# Patient Record
Sex: Female | Born: 1995 | Race: Black or African American | Hispanic: No | Marital: Single | Smoking: Never smoker
Health system: Southern US, Community
[De-identification: ages and names within clinical notes are randomized; demographics above are authoritative.]

## PROBLEM LIST (undated history)

## (undated) ENCOUNTER — Inpatient Hospital Stay (HOSPITAL_COMMUNITY): Payer: Self-pay

## (undated) DIAGNOSIS — D649 Anemia, unspecified: Secondary | ICD-10-CM

---

## 2018-08-07 LAB — OB RESULTS CONSOLE ABO/RH: RH Type: POSITIVE

## 2018-08-07 LAB — OB RESULTS CONSOLE RUBELLA ANTIBODY, IGM: Rubella: IMMUNE

## 2018-08-07 LAB — OB RESULTS CONSOLE HEPATITIS B SURFACE ANTIGEN: Hepatitis B Surface Ag: NEGATIVE

## 2018-08-07 LAB — OB RESULTS CONSOLE ANTIBODY SCREEN: Antibody Screen: NEGATIVE

## 2018-08-07 LAB — OB RESULTS CONSOLE GC/CHLAMYDIA
Chlamydia: NEGATIVE
Gonorrhea: NEGATIVE

## 2018-08-07 LAB — OB RESULTS CONSOLE HIV ANTIBODY (ROUTINE TESTING): HIV: NONREACTIVE

## 2018-08-07 LAB — OB RESULTS CONSOLE RPR: RPR: NONREACTIVE

## 2018-11-01 NOTE — L&D Delivery Note (Signed)
Delivery Note Patient pushed well for 1.5 hours. At 3:32 AM a viable female was delivered via  (Presentation: OA ).  APGAR: 5 , 8; weight 2970 gm (6lb 8.8oz).   Placenta status: Spontaneous, in tact .  Cord: 3V  with the following complications: None.  Cord pH: n/a  Anesthesia:  Epidural Episiotomy:  None Lacerations:  Right labial, repaired.  Left labial, hemostatic Suture Repair: 3.0 vicryl rapide Est. Blood Loss (mL):  300 mL  Following delivery, baby had good heart rate, but noted to have periodic breathing and oxygen requirement. Neonatologist call for evaluation.  Upon arrival she had good tone and no apnea however she needed BBO2 to maintain saturations in the 90's.  Given chorioamnionitis and gbs+ mom, she will be taken to NICU for r/o sepsis.   Mom to postpartum.  Baby to NICU.  Caitlin Bauer 01/20/2019, 4:03 AM

## 2018-12-21 ENCOUNTER — Other Ambulatory Visit (HOSPITAL_COMMUNITY): Payer: Self-pay | Admitting: *Deleted

## 2018-12-22 ENCOUNTER — Encounter (HOSPITAL_COMMUNITY): Payer: Medicaid Other

## 2018-12-29 ENCOUNTER — Encounter (HOSPITAL_COMMUNITY): Payer: Medicaid Other

## 2019-01-01 ENCOUNTER — Inpatient Hospital Stay (HOSPITAL_COMMUNITY)
Admission: AD | Admit: 2019-01-01 | Discharge: 2019-01-01 | Disposition: A | Discharge status: Home / Self Care | Payer: Medicaid Other | Attending: Obstetrics and Gynecology | Admitting: Obstetrics and Gynecology

## 2019-01-01 ENCOUNTER — Encounter (HOSPITAL_COMMUNITY): Payer: Self-pay

## 2019-01-01 DIAGNOSIS — Z3A35 35 weeks gestation of pregnancy: Secondary | ICD-10-CM | POA: Insufficient documentation

## 2019-01-01 DIAGNOSIS — O479 False labor, unspecified: Secondary | ICD-10-CM

## 2019-01-01 DIAGNOSIS — O4703 False labor before 37 completed weeks of gestation, third trimester: Secondary | ICD-10-CM | POA: Diagnosis not present

## 2019-01-01 HISTORY — DX: Anemia, unspecified: D64.9

## 2019-01-01 LAB — URINALYSIS, ROUTINE W REFLEX MICROSCOPIC
Bilirubin Urine: NEGATIVE
Glucose, UA: 50 mg/dL — AB
Hgb urine dipstick: NEGATIVE
Ketones, ur: NEGATIVE mg/dL
Nitrite: NEGATIVE
Protein, ur: NEGATIVE mg/dL
SPECIFIC GRAVITY, URINE: 1.006 (ref 1.005–1.030)
pH: 7 (ref 5.0–8.0)

## 2019-01-01 NOTE — MAU Note (Signed)
Having contractions-unsure how far apart.  No LOF/VB. +FM.  Reports no issues w/ her pregnancy.

## 2019-01-01 NOTE — Discharge Instructions (Signed)
Signs and Symptoms of Labor  Labor is your body's natural process of moving your baby, placenta, and umbilical cord out of your uterus. The process of labor usually starts when your baby is full-term, between 37 and 40 weeks of pregnancy.  How will I know when I am close to going into labor?  As your body prepares for labor and the birth of your baby, you may notice the following symptoms in the weeks and days before true labor starts:   Having a strong desire to get your home ready to receive your new baby. This is called nesting. Nesting may be a sign that labor is approaching, and it may occur several weeks before birth. Nesting may involve cleaning and organizing your home.   Passing a small amount of thick, bloody mucus out of your vagina (normal bloody show or losing your mucus plug). This may happen more than a week before labor begins, or it might occur right before labor begins as the opening of the cervix starts to widen (dilate). For some women, the entire mucus plug passes at once. For others, smaller portions of the mucus plug may gradually pass over several days.   Your baby moving (dropping) lower in your pelvis to get into position for birth (lightening). When this happens, you may feel more pressure on your bladder and pelvic bone and less pressure on your ribs. This may make it easier to breathe. It may also cause you to need to urinate more often and have problems with bowel movements.   Having "practice contractions" (Braxton Hicks contractions) that occur at irregular (unevenly spaced) intervals that are more than 10 minutes apart. This is also called false labor. False labor contractions are common after exercise or sexual activity, and they will stop if you change position, rest, or drink fluids. These contractions are usually mild and do not get stronger over time. They may feel like:  ? A backache or back pain.  ? Mild cramps, similar to menstrual cramps.  ? Tightening or pressure in  your abdomen.  Other early symptoms that labor may be starting soon include:   Nausea or loss of appetite.   Diarrhea.   Having a sudden burst of energy, or feeling very tired.   Mood changes.   Having trouble sleeping.  How will I know when labor has begun?  Signs that true labor has begun may include:   Having contractions that come at regular (evenly spaced) intervals and increase in intensity. This may feel like more intense tightening or pressure in your abdomen that moves to your back.  ? Contractions may also feel like rhythmic pain in your upper thighs or back that comes and goes at regular intervals.  ? For first-time mothers, this change in intensity of contractions often occurs at a more gradual pace.  ? Women who have given birth before may notice a more rapid progression of contraction changes.   Having a feeling of pressure in the vaginal area.   Your water breaking (rupture of membranes). This is when the sac of fluid that surrounds your baby breaks. When this happens, you will notice fluid leaking from your vagina. This may be clear or blood-tinged. Labor usually starts within 24 hours of your water breaking, but it may take longer to begin.  ? Some women notice this as a gush of fluid.  ? Others notice that their underwear repeatedly becomes damp.  Follow these instructions at home:     When labor   starts, or if your water breaks, call your health care provider or nurse care line. Based on your situation, they will determine when you should go in for an exam.   When you are in early labor, you may be able to rest and manage symptoms at home. Some strategies to try at home include:  ? Breathing and relaxation techniques.  ? Taking a warm bath or shower.  ? Listening to music.  ? Using a heating pad on the lower back for pain. If you are directed to use heat:   Place a towel between your skin and the heat source.   Leave the heat on for 20-30 minutes.   Remove the heat if your skin turns  bright red. This is especially important if you are unable to feel pain, heat, or cold. You may have a greater risk of getting burned.  Get help right away if:   You have painful, regular contractions that are 5 minutes apart or less.   Labor starts before you are [redacted] weeks along in your pregnancy.   You have a fever.   You have a headache that does not go away.   You have bright red blood coming from your vagina.   You do not feel your baby moving.   You have a sudden onset of:  ? Severe headache with vision problems.  ? Nausea, vomiting, or diarrhea.  ? Chest pain or shortness of breath.  These symptoms may be an emergency. If your health care provider recommends that you go to the hospital or birth center where you plan to deliver, do not drive yourself. Have someone else drive you, or call emergency services (911 in the [place]  Summary   Labor is your body's natural process of moving your baby, placenta, and umbilical cord out of your uterus.   The process of labor usually starts when your baby is full-term, between 37 and 40 weeks of pregnancy.   When labor starts, or if your water breaks, call your health care provider or nurse care line. Based on your situation, they will determine when you should go in for an exam.  This information is not intended to replace advice given to you by your health care provider. Make sure you discuss any questions you have with your health care provider.  Document Released: 03/25/2017 Document Revised: 03/25/2017 Document Reviewed: 03/25/2017  Elsevier Interactive Patient Education  2019 [employer]  Fetal Movement Counts  Patient Name: ________________________________________________ Patient Due Date: ____________________  What is a fetal movement count?    A fetal movement count is the number of times that you feel your baby move during a certain amount of time. This may also be called a fetal kick count. A fetal movement count is recommended for every pregnant  woman. You may be asked to start counting fetal movements as early as week 28 of your pregnancy.  Pay attention to when your baby is most active. You may notice your baby's sleep and wake cycles. You may also notice things that make your baby move more. You should do a fetal movement count:   When your baby is normally most active.   At the same time each day.  A good time to count movements is while you are resting, after having something to eat and drink.  How do I count fetal movements?  1. Find a quiet, comfortable area. Sit, or lie down on your side.  2. Write down the date, the start   time and stop time, and the number of movements that you felt between those two times. Take this information with you to your health care visits.  3. For 2 hours, count kicks, flutters, swishes, rolls, and jabs. You should feel at least 10 movements during 2 hours.  4. You may stop counting after you have felt 10 movements.  5. If you do not feel 10 movements in 2 hours, have something to eat and drink. Then, keep resting and counting for 1 hour. If you feel at least 4 movements during that hour, you may stop counting.  Contact a health care provider if:   You feel fewer than 4 movements in 2 hours.   Your baby is not moving like he or she usually does.  Date: ____________ Start time: ____________ Stop time: ____________ Movements: ____________  Date: ____________ Start time: ____________ Stop time: ____________ Movements: ____________  Date: ____________ Start time: ____________ Stop time: ____________ Movements: ____________  Date: ____________ Start time: ____________ Stop time: ____________ Movements: ____________  Date: ____________ Start time: ____________ Stop time: ____________ Movements: ____________  Date: ____________ Start time: ____________ Stop time: ____________ Movements: ____________  Date: ____________ Start time: ____________ Stop time: ____________ Movements: ____________  Date: ____________ Start time:  ____________ Stop time: ____________ Movements: ____________  Date: ____________ Start time: ____________ Stop time: ____________ Movements: ____________  This information is not intended to replace advice given to you by your health care provider. Make sure you discuss any questions you have with your health care provider.  Document Released: 11/17/2006 Document Revised: 06/16/2016 Document Reviewed: 11/27/2015  Elsevier Interactive Patient Education  2019 [employer]

## 2019-01-01 NOTE — MAU Provider Note (Signed)
Chief Complaint:  Contractions   First Provider Initiated Contact with Patient 01/01/19 2039     HPI: Caitlin Bauer is a 23 y.o. G1P0 at [redacted]w[redacted]d who presents to maternity admissions reporting contractions. Can't tell how frequent they are but feels like they are more frequent & painful than her normal braxton hicks. Had intercourse earlier today. Denies leakage of fluid or vaginal bleeding. Good fetal movement.  Location: low back Quality: contraction Severity: 9/10 in pain scale Duration: <1 day Timing: intermittent Modifying factors: none Associated signs and symptoms: none  Past Medical History:  Diagnosis Date  . Anemia    OB History  Gravida Para Term Preterm AB Living  1            SAB TAB Ectopic Multiple Live Births               # Outcome Date GA Lbr Len/2nd Weight Sex Delivery Anes PTL Lv  1 Current            History reviewed. No pertinent surgical history. No family history on file. Social History   Tobacco Use  . Smoking status: Not on file  Substance Use Topics  . Alcohol use: Not on file  . Drug use: Not on file   No Known Allergies No medications prior to admission.    I have reviewed patient's Past Medical Hx, Surgical Hx, Family Hx, Social Hx, medications and allergies.   ROS:  Review of Systems  Constitutional: Negative.   Gastrointestinal: Negative.   Genitourinary: Negative.   Musculoskeletal: Positive for back pain.    Physical Exam   Patient Vitals for the past 24 hrs:  BP Temp Pulse Resp SpO2 Height Weight  01/01/19 2200 102/61 - 80 19 - - -  01/01/19 2037 (!) 94/58 98.5 F (36.9 C) 89 19 100 % 5\' 3"  (1.6 m) 63.2 kg    Constitutional: Well-developed, well-nourished female in no acute distress.  Cardiovascular: normal rate & rhythm, no murmur Respiratory: normal effort, lung sounds clear throughout GI: Abd soft, non-tender, gravid appropriate for gestational age. Pos BS x 4 MS: Extremities nontender, no edema, normal ROM Neurologic:  Alert and oriented x 4.  GU:      Pelvic: NEFG, physiologic discharge, no blood, cervix clean.   Dilation: 1 Effacement (%): 60 Cervical Position: Posterior Presentation: Vertex Exam by:: Judeth Horn, NP  NST:  Baseline: 140 bpm, Variability: Good {> 6 bpm), Accelerations: Reactive and Decelerations: Absent   Labs: Results for orders placed or performed during the hospital encounter of 01/01/19 (from the past 24 hour(s))  Urinalysis, Routine w reflex microscopic     Status: Abnormal   Collection Time: 01/01/19  8:18 PM  Result Value Ref Range   Color, Urine STRAW (A) YELLOW   APPearance CLEAR CLEAR   Specific Gravity, Urine 1.006 1.005 - 1.030   pH 7.0 5.0 - 8.0   Glucose, UA 50 (A) NEGATIVE mg/dL   Hgb urine dipstick NEGATIVE NEGATIVE   Bilirubin Urine NEGATIVE NEGATIVE   Ketones, ur NEGATIVE NEGATIVE mg/dL   Protein, ur NEGATIVE NEGATIVE mg/dL   Nitrite NEGATIVE NEGATIVE   Leukocytes,Ua LARGE (A) NEGATIVE   RBC / HPF 0-5 0 - 5 RBC/hpf   WBC, UA 11-20 0 - 5 WBC/hpf   Bacteria, UA FEW (A) NONE SEEN   Squamous Epithelial / LPF 0-5 0 - 5   Mucus PRESENT     Imaging:  No results found.  MAU Course: Orders Placed This Encounter  Procedures  .  Culture, OB Urine  . Urinalysis, Routine w reflex microscopic  . Discharge patient   No orders of the defined types were placed in this encounter.   MDM: Reactive NST Not consistently contracting on monitor. Cervix unchanged after 1+ hr of monitoring & pt reports ctx have spaced out.   Assessment: 1. False labor   2. [redacted] weeks gestation of pregnancy     Plan: Discharge home in stable condition.  Preterm Labor precautions and fetal kick counts Follow-up Information    Ob/Gyn, [facility] Follow up.   Contact information: [address] [place] [address] [phone]           Allergies as of 01/01/2019   No Known Allergies     Medication List    TAKE these medications   ferrous sulfate 325  (65 FE) MG EC tablet Take 325 mg by mouth 3 (three) times daily with meals.       Judeth Horn, NP 01/01/2019 10:47 PM

## 2019-01-04 LAB — CULTURE, OB URINE

## 2019-01-05 ENCOUNTER — Encounter (HOSPITAL_COMMUNITY): Payer: Self-pay

## 2019-01-05 ENCOUNTER — Telehealth (HOSPITAL_COMMUNITY): Payer: Self-pay

## 2019-01-05 DIAGNOSIS — B951 Streptococcus, group B, as the cause of diseases classified elsewhere: Secondary | ICD-10-CM | POA: Insufficient documentation

## 2019-01-05 NOTE — Progress Notes (Signed)
Patient GBS returns positive for 1k colonies of GBS.  Attempted to contact via phone x 2.  Will add to problem list and plan to treat in pregnancy.  Cherre Robins MSN, CNM 01/05/2019 3:59 PM

## 2019-01-15 ENCOUNTER — Other Ambulatory Visit (HOSPITAL_COMMUNITY): Payer: Self-pay | Admitting: *Deleted

## 2019-01-16 ENCOUNTER — Other Ambulatory Visit: Payer: Self-pay

## 2019-01-16 ENCOUNTER — Ambulatory Visit (HOSPITAL_COMMUNITY)
Admission: RE | Admit: 2019-01-16 | Discharge: 2019-01-16 | Disposition: A | Discharge status: Home / Self Care | Payer: Medicaid Other | Source: Ambulatory Visit | Attending: Obstetrics | Admitting: Obstetrics

## 2019-01-16 DIAGNOSIS — D649 Anemia, unspecified: Secondary | ICD-10-CM | POA: Diagnosis present

## 2019-01-16 MED ORDER — SODIUM CHLORIDE 0.9 % IV SOLN
510.0000 mg | INTRAVENOUS | Status: DC
  Administered 2019-01-16: 510 mg via INTRAVENOUS
  Filled 2019-01-16: qty 510

## 2019-01-16 NOTE — Discharge Instructions (Signed)

## 2019-01-18 ENCOUNTER — Inpatient Hospital Stay (EMERGENCY_DEPARTMENT_HOSPITAL)
Admission: AD | Admit: 2019-01-18 | Discharge: 2019-01-19 | Disposition: A | Discharge status: Home / Self Care | Payer: Medicaid Other | Source: Home / Self Care | Attending: Obstetrics and Gynecology | Admitting: Obstetrics and Gynecology

## 2019-01-18 ENCOUNTER — Inpatient Hospital Stay (HOSPITAL_COMMUNITY): Payer: Medicaid Other

## 2019-01-18 ENCOUNTER — Inpatient Hospital Stay (HOSPITAL_COMMUNITY)
Admission: AD | Admit: 2019-01-18 | Discharge: 2019-01-18 | Disposition: A | Discharge status: Home / Self Care | Payer: Medicaid Other | Source: Home / Self Care | Attending: Obstetrics and Gynecology | Admitting: Obstetrics and Gynecology

## 2019-01-18 ENCOUNTER — Other Ambulatory Visit: Payer: Self-pay

## 2019-01-18 ENCOUNTER — Encounter (HOSPITAL_COMMUNITY): Payer: Self-pay | Admitting: *Deleted

## 2019-01-18 DIAGNOSIS — O479 False labor, unspecified: Secondary | ICD-10-CM | POA: Insufficient documentation

## 2019-01-18 DIAGNOSIS — Z3A38 38 weeks gestation of pregnancy: Secondary | ICD-10-CM | POA: Insufficient documentation

## 2019-01-18 DIAGNOSIS — O368390 Maternal care for abnormalities of the fetal heart rate or rhythm, unspecified trimester, not applicable or unspecified: Secondary | ICD-10-CM

## 2019-01-18 DIAGNOSIS — O368330 Maternal care for abnormalities of the fetal heart rate or rhythm, third trimester, not applicable or unspecified: Secondary | ICD-10-CM

## 2019-01-18 DIAGNOSIS — Z79899 Other long term (current) drug therapy: Secondary | ICD-10-CM | POA: Insufficient documentation

## 2019-01-18 DIAGNOSIS — O368391 Maternal care for abnormalities of the fetal heart rate or rhythm, unspecified trimester, fetus 1: Secondary | ICD-10-CM

## 2019-01-18 LAB — POCT FERN TEST: POCT Fern Test: NEGATIVE

## 2019-01-18 NOTE — MAU Provider Note (Signed)
Chief Complaint:  Contractions   First Provider Initiated Contact with Patient 01/18/19 2235      HPI: Caitlin Bauer is a 23 y.o. G1P0 at 45w2dwho presents to maternity admissions reporting contractions and wetness on her clothes as she arrived here.  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 . She reports good fetal movement, denies vaginal itching/burning, urinary symptoms, h/a, dizziness, n/v, diarrhea, constipation or fever/chills.  She denies headache, visual changes or RUQ abdominal pain.   Past Medical History: Past Medical History:  Diagnosis Date  . Anemia     Past obstetric history: OB History  Gravida Para Term Preterm AB Living  1            SAB TAB Ectopic Multiple Live Births               # Outcome Date GA Lbr Len/2nd Weight Sex Delivery Anes PTL Lv  1 Current             Past Surgical History: No past surgical history on file.  Family History: No family history on file.  Social History: Social History   Tobacco Use  . Smoking status: Never Smoker  . Smokeless tobacco: Never Used  Substance Use Topics  . Alcohol use: Not on file  . Drug use: Not on file    Allergies: No Known Allergies  Meds:  Medications Prior to Admission  Medication Sig Dispense Refill Last Dose  . ferrous sulfate 325 (65 FE) MG EC tablet Take 325 mg by mouth 3 (three) times daily with meals.   01/17/2019 at Unknown time  . valACYclovir (VALTREX) 500 MG tablet Take 500 mg by mouth 2 (two) times daily.   01/17/2019 at Unknown time    I have reviewed patient's Past Medical Hx, Surgical Hx, Family Hx, Social Hx, medications and allergies.   ROS:  Review of Systems  Constitutional: Negative for fever.  Gastrointestinal: Positive for abdominal pain. Negative for constipation, diarrhea and nausea.  Genitourinary: Positive for pelvic pain, vaginal bleeding and vaginal discharge. Negative for dysuria.   Other  systems negative  Physical Exam   Patient Vitals for the past 24 hrs:  BP Temp Pulse Resp SpO2 Height Weight  01/18/19 2203 (!) 100/55 98.3 F (36.8 C) 95 19 96 % 5\' 3"  (1.6 m) 63 kg   Constitutional: Well-developed, well-nourished female in no acute distress.  Cardiovascular: normal rate and rhythm Respiratory: normal effort, clear to auscultation bilaterally GI: Abd soft, non-tender, gravid appropriate for gestational age.   No rebound or guarding. MS: Extremities nontender, no edema, normal ROM Neurologic: Alert and oriented x 4.  GU: Neg CVAT.  PELVIC EXAM: scant pink creamy discharge, vaginal walls and external genitalia normal, no lesions visible, though there is one small area at introitus which is darker pink but not tender and not excoriated.   Negative pooling, no ferning  Bimanual exam: Cervix firm, posterior, neg CMT, uterus nontender, Fundal Height consistent with dates, adnexa without tenderness, enlargement, or mass Dilation: 2.5 Effacement (%): 50 Station: -2 Presentation: Vertex Exam by:: Lauren Cox RN     Repeat cervical exam is unchanged  FHT:  Baseline 140 , moderate variability, accelerations present, no decelerations Contractions:  Irregular     Labs: Results for orders placed or performed during the hospital encounter of 01/18/19 (from the past 24 hour(s))  POCT fern test     Status:  Normal   Collection Time: 01/18/19 10:36 PM  Result Value Ref Range   POCT Fern Test Negative = intact amniotic membranes     B/Positive/-- (10/07 0000)  Imaging:  No results found.  MAU Course/MDM: Speculum exam was negative for ruptured membranes NST reviewed, reactive, irregular contraction pattern. Fetal heart rate pattern was reactive but there was a single variable decel with a triplet contraction.  We ordered a BPP which scored 6/8 (2 off for breathing)  We monitored her after the BPP and FHR pattern was reactive. I had Dr Debroah Loop review entire tracing and BPP  and he felt it was reassuring enough to let her go home Discussed lack of cervical change and discussed progression of labor, what to watch for. Encouraged her to come back when she felt like she needed to.   Consult Dr Debroah Loop with presentation, exam findings and test results.  Treatments in MAU included EFM, Speculum Exam.    Assessment: Single intrauterine pregnancy at [redacted]w[redacted]d Prodromal contractions vs early latent phase Reassuring NST and BPP  Plan: Discharge home Labor precautions and fetal kick counts Follow up in Office for prenatal visits and recheck of status  Encouraged to return here or to other Urgent Care/ED if she develops worsening of symptoms, increase in pain, fever, or other concerning symptoms.   Pt stable at time of discharge.  Wynelle Bourgeois CNM, MSN Certified Nurse-Midwife 01/18/2019 10:35 PM

## 2019-01-18 NOTE — Discharge Instructions (Signed)
Fetal Movement Counts °Patient Name: ________________________________________________ Patient Due Date: ____________________ °What is a fetal movement count? ° °A fetal movement count is the number of times that you feel your baby move during a certain amount of time. This may also be called a fetal kick count. A fetal movement count is recommended for every pregnant woman. You may be asked to start counting fetal movements as early as week 28 of your pregnancy. °Pay attention to when your baby is most active. You may notice your baby's sleep and wake cycles. You may also notice things that make your baby move more. You should do a fetal movement count: °· When your baby is normally most active. °· At the same time each day. °A good time to count movements is while you are resting, after having something to eat and drink. °How do I count fetal movements? °1. Find a quiet, comfortable area. Sit, or lie down on your side. °2. Write down the date, the start time and stop time, and the number of movements that you felt between those two times. Take this information with you to your health care visits. °3. For 2 hours, count kicks, flutters, swishes, rolls, and jabs. You should feel at least 10 movements during 2 hours. °4. You may stop counting after you have felt 10 movements. °5. If you do not feel 10 movements in 2 hours, have something to eat and drink. Then, keep resting and counting for 1 hour. If you feel at least 4 movements during that hour, you may stop counting. °Contact a health care provider if: °· You feel fewer than 4 movements in 2 hours. °· Your baby is not moving like he or she usually does. °Date: ____________ Start time: ____________ Stop time: ____________ Movements: ____________ °Date: ____________ Start time: ____________ Stop time: ____________ Movements: ____________ °Date: ____________ Start time: ____________ Stop time: ____________ Movements: ____________ °Date: ____________ Start time:  ____________ Stop time: ____________ Movements: ____________ °Date: ____________ Start time: ____________ Stop time: ____________ Movements: ____________ °Date: ____________ Start time: ____________ Stop time: ____________ Movements: ____________ °Date: ____________ Start time: ____________ Stop time: ____________ Movements: ____________ °Date: ____________ Start time: ____________ Stop time: ____________ Movements: ____________ °Date: ____________ Start time: ____________ Stop time: ____________ Movements: ____________ °This information is not intended to replace advice given to you by your health care provider. Make sure you discuss any questions you have with your health care provider. °Document Released: 11/17/2006 Document Revised: 06/16/2016 Document Reviewed: 11/27/2015 °Elsevier Interactive Patient Education © 2019 [employer] °Signs and Symptoms of Labor °Labor is your body's natural process of moving your baby, placenta, and umbilical cord out of your uterus. The process of labor usually starts when your baby is full-term, between 37 and 40 weeks of pregnancy. °How will I know when I am close to going into labor? °As your body prepares for labor and the birth of your baby, you may notice the following symptoms in the weeks and days before true labor starts: °· Having a strong desire to get your home ready to receive your new baby. This is called nesting. Nesting may be a sign that labor is approaching, and it may occur several weeks before birth. Nesting may involve cleaning and organizing your home. °· Passing a small amount of thick, bloody mucus out of your vagina (normal bloody show or losing your mucus plug). This may happen more than a week before labor begins, or it might occur right before labor begins as the opening of the cervix   starts to widen (dilate). For some women, the entire mucus plug passes at once. For others, smaller portions of the mucus plug may gradually pass over several  days. °· Your baby moving (dropping) lower in your pelvis to get into position for birth (lightening). When this happens, you may feel more pressure on your bladder and pelvic bone and less pressure on your ribs. This may make it easier to breathe. It may also cause you to need to urinate more often and have problems with bowel movements. °· Having "practice contractions" (Braxton Hicks contractions) that occur at irregular (unevenly spaced) intervals that are more than 10 minutes apart. This is also called false labor. False labor contractions are common after exercise or sexual activity, and they will stop if you change position, rest, or drink fluids. These contractions are usually mild and do not get stronger over time. They may feel like: °? A backache or back pain. °? Mild cramps, similar to menstrual cramps. °? Tightening or pressure in your abdomen. °Other early symptoms that labor may be starting soon include: °· Nausea or loss of appetite. °· Diarrhea. °· Having a sudden burst of energy, or feeling very tired. °· Mood changes. °· Having trouble sleeping. °How will I know when labor has begun? °Signs that true labor has begun may include: °· Having contractions that come at regular (evenly spaced) intervals and increase in intensity. This may feel like more intense tightening or pressure in your abdomen that moves to your back. °? Contractions may also feel like rhythmic pain in your upper thighs or back that comes and goes at regular intervals. °? For first-time mothers, this change in intensity of contractions often occurs at a more gradual pace. °? Women who have given birth before may notice a more rapid progression of contraction changes. °· Having a feeling of pressure in the vaginal area. °· Your water breaking (rupture of membranes). This is when the sac of fluid that surrounds your baby breaks. When this happens, you will notice fluid leaking from your vagina. This may be clear or blood-tinged.  Labor usually starts within 24 hours of your water breaking, but it may take longer to begin. °? Some women notice this as a gush of fluid. °? Others notice that their underwear repeatedly becomes damp. °Follow these instructions at home: ° °· When labor starts, or if your water breaks, call your health care provider or nurse care line. Based on your situation, they will determine when you should go in for an exam. °· When you are in early labor, you may be able to rest and manage symptoms at home. Some strategies to try at home include: °? Breathing and relaxation techniques. °? Taking a warm bath or shower. °? Listening to music. °? Using a heating pad on the lower back for pain. If you are directed to use heat: °§ Place a towel between your skin and the heat source. °§ Leave the heat on for 20-30 minutes. °§ Remove the heat if your skin turns bright red. This is especially important if you are unable to feel pain, heat, or cold. You may have a greater risk of getting burned. °Get help right away if: °· You have painful, regular contractions that are 5 minutes apart or less. °· Labor starts before you are [redacted] weeks along in your pregnancy. °· You have a fever. °· You have a headache that does not go away. °· You have bright red blood coming from your vagina. °·   You do not feel your baby moving. °· You have a sudden onset of: °? Severe headache with vision problems. °? Nausea, vomiting, or diarrhea. °? Chest pain or shortness of breath. °These symptoms may be an emergency. If your health care provider recommends that you go to the hospital or birth center where you plan to deliver, do not drive yourself. Have someone else drive you, or call emergency services (911 in the [place] °Summary °· Labor is your body's natural process of moving your baby, placenta, and umbilical cord out of your uterus. °· The process of labor usually starts when your baby is full-term, between 37 and 40 weeks of pregnancy. °· When labor  starts, or if your water breaks, call your health care provider or nurse care line. Based on your situation, they will determine when you should go in for an exam. °This information is not intended to replace advice given to you by your health care provider. Make sure you discuss any questions you have with your health care provider. °Document Released: 03/25/2017 Document Revised: 03/25/2017 Document Reviewed: 03/25/2017 °Elsevier Interactive Patient Education © 2019 [employer] ° °

## 2019-01-18 NOTE — MAU Note (Signed)
Pt presents to MAU with complaints of contractions that started last night. Denies any LOF or VB. +FM

## 2019-01-18 NOTE — MAU Provider Note (Signed)
S: Patient is here for RN labor evaluation. Strip, vital signs, & chart Reviewed   O:  Vitals:   01/18/19 1109  BP: 114/60  Pulse: 99  Resp: 16  Temp: 98.2 F (36.8 C)   No results found for this or any previous visit (from the past 24 hour(s)).  Dilation: 2 Effacement (%): 70 Cervical Position: Posterior Station: -3 Presentation: Vertex Exam by:: Ginger Morris RN   FHR: 145 bpm, Mod Var, No  Decels, 15x15 Accels UC: irregular ctx   A: 1. False labor   2. [redacted] weeks gestation of pregnancy      P:  RN to discharge home in stable condition with return precautions & fetal kick counts  Judeth Horn FNP 12:25 PM

## 2019-01-18 NOTE — MAU Note (Addendum)
CTX around 4 minutes apart.  Reports feeling "wet" but no large gush of fluid.  Saw some spotting earlier in her mucous plug. Endorses + FM. GBS positive.  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  Takes it everyday "when she can remember."  Reports no recent outbreaks.

## 2019-01-19 ENCOUNTER — Inpatient Hospital Stay (HOSPITAL_COMMUNITY): Payer: Medicaid Other | Admitting: Anesthesiology

## 2019-01-19 ENCOUNTER — Other Ambulatory Visit: Payer: Self-pay

## 2019-01-19 ENCOUNTER — Inpatient Hospital Stay (HOSPITAL_COMMUNITY)
Admission: AC | Admit: 2019-01-19 | Discharge: 2019-01-19 | Disposition: A | Discharge status: Home / Self Care | Payer: Medicaid Other | Source: Home / Self Care | Attending: Obstetrics | Admitting: Obstetrics

## 2019-01-19 ENCOUNTER — Encounter (HOSPITAL_COMMUNITY): Payer: Self-pay | Admitting: *Deleted

## 2019-01-19 ENCOUNTER — Inpatient Hospital Stay (HOSPITAL_COMMUNITY)
Admission: AD | Admit: 2019-01-19 | Discharge: 2019-01-22 | DRG: 805 | Disposition: A | Discharge status: Home / Self Care | Payer: Medicaid Other | Attending: Obstetrics | Admitting: Obstetrics

## 2019-01-19 DIAGNOSIS — O9832 Other infections with a predominantly sexual mode of transmission complicating childbirth: Secondary | ICD-10-CM | POA: Diagnosis present

## 2019-01-19 DIAGNOSIS — O411230 Chorioamnionitis, third trimester, not applicable or unspecified: Secondary | ICD-10-CM | POA: Diagnosis present

## 2019-01-19 DIAGNOSIS — D649 Anemia, unspecified: Secondary | ICD-10-CM | POA: Diagnosis present

## 2019-01-19 DIAGNOSIS — Z3A38 38 weeks gestation of pregnancy: Secondary | ICD-10-CM | POA: Diagnosis not present

## 2019-01-19 DIAGNOSIS — O99824 Streptococcus B carrier state complicating childbirth: Secondary | ICD-10-CM | POA: Diagnosis present

## 2019-01-19 DIAGNOSIS — O9902 Anemia complicating childbirth: Secondary | ICD-10-CM | POA: Diagnosis present

## 2019-01-19 DIAGNOSIS — O26893 Other specified pregnancy related conditions, third trimester: Secondary | ICD-10-CM | POA: Diagnosis present

## 2019-01-19 DIAGNOSIS — A6000 Herpesviral infection of urogenital system, unspecified: Secondary | ICD-10-CM | POA: Diagnosis present

## 2019-01-19 LAB — CBC
HCT: 31.6 % — ABNORMAL LOW (ref 36.0–46.0)
Hemoglobin: 9.8 g/dL — ABNORMAL LOW (ref 12.0–15.0)
MCH: 28.2 pg (ref 26.0–34.0)
MCHC: 31 g/dL (ref 30.0–36.0)
MCV: 90.8 fL (ref 80.0–100.0)
PLATELETS: 220 10*3/uL (ref 150–400)
RBC: 3.48 MIL/uL — ABNORMAL LOW (ref 3.87–5.11)
RDW: 16.7 % — ABNORMAL HIGH (ref 11.5–15.5)
WBC: 13.5 10*3/uL — ABNORMAL HIGH (ref 4.0–10.5)
nRBC: 0 % (ref 0.0–0.2)

## 2019-01-19 LAB — TYPE AND SCREEN
ABO/RH(D): B POS
Antibody Screen: NEGATIVE

## 2019-01-19 LAB — ABO/RH: ABO/RH(D): B POS

## 2019-01-19 MED ORDER — OXYTOCIN 40 UNITS IN NORMAL SALINE INFUSION - SIMPLE MED
2.5000 [IU]/h | INTRAVENOUS | Status: DC
  Administered 2019-01-20: 2.5 [IU]/h via INTRAVENOUS
  Filled 2019-01-19: qty 1000

## 2019-01-19 MED ORDER — SODIUM CHLORIDE 0.9 % IV SOLN
2.0000 g | Freq: Four times a day (QID) | INTRAVENOUS | Status: DC
  Administered 2019-01-19: 2 g via INTRAVENOUS
  Filled 2019-01-19: qty 2000

## 2019-01-19 MED ORDER — LIDOCAINE HCL (PF) 1 % IJ SOLN
30.0000 mL | INTRAMUSCULAR | Status: DC | PRN
  Filled 2019-01-19: qty 30

## 2019-01-19 MED ORDER — LACTATED RINGERS IV SOLN
INTRAVENOUS | Status: DC
  Administered 2019-01-19: 17:00:00 via INTRAVENOUS

## 2019-01-19 MED ORDER — GENTAMICIN SULFATE 40 MG/ML IJ SOLN
5.0000 mg/kg | INTRAVENOUS | Status: DC
  Administered 2019-01-20: 320 mg via INTRAVENOUS
  Filled 2019-01-19 (×2): qty 8

## 2019-01-19 MED ORDER — DIPHENHYDRAMINE HCL 50 MG/ML IJ SOLN: 12.5000 mg | INTRAMUSCULAR | Status: DC | PRN

## 2019-01-19 MED ORDER — EPHEDRINE 5 MG/ML INJ: 10.0000 mg | INTRAVENOUS | Status: DC | PRN

## 2019-01-19 MED ORDER — LACTATED RINGERS AMNIOINFUSION
INTRAVENOUS | Status: DC
  Administered 2019-01-19: via INTRAUTERINE

## 2019-01-19 MED ORDER — OXYCODONE-ACETAMINOPHEN 5-325 MG PO TABS: 2.0000 | ORAL_TABLET | ORAL | Status: DC | PRN

## 2019-01-19 MED ORDER — PHENYLEPHRINE 40 MCG/ML (10ML) SYRINGE FOR IV PUSH (FOR BLOOD PRESSURE SUPPORT): 80.0000 ug | PREFILLED_SYRINGE | INTRAVENOUS | Status: DC | PRN

## 2019-01-19 MED ORDER — OXYTOCIN BOLUS FROM INFUSION
500.0000 mL | Freq: Once | INTRAVENOUS | Status: AC
  Administered 2019-01-20: 500 mL via INTRAVENOUS

## 2019-01-19 MED ORDER — ONDANSETRON HCL 4 MG/2ML IJ SOLN: 4.0000 mg | Freq: Four times a day (QID) | INTRAMUSCULAR | Status: DC | PRN

## 2019-01-19 MED ORDER — LACTATED RINGERS IV SOLN: 500.0000 mL | INTRAVENOUS | Status: DC | PRN

## 2019-01-19 MED ORDER — OXYCODONE-ACETAMINOPHEN 5-325 MG PO TABS: 1.0000 | ORAL_TABLET | ORAL | Status: DC | PRN

## 2019-01-19 MED ORDER — SOD CITRATE-CITRIC ACID 500-334 MG/5ML PO SOLN: 30.0000 mL | ORAL | Status: DC | PRN

## 2019-01-19 MED ORDER — SODIUM CHLORIDE 0.9 % IV SOLN
5.0000 10*6.[IU] | Freq: Once | INTRAVENOUS | Status: AC
  Administered 2019-01-19: 5 10*6.[IU] via INTRAVENOUS
  Filled 2019-01-19: qty 5

## 2019-01-19 MED ORDER — LIDOCAINE HCL (PF) 1 % IJ SOLN
INTRAMUSCULAR | Status: DC | PRN
  Administered 2019-01-19 (×2): 6 mL via EPIDURAL

## 2019-01-19 MED ORDER — FENTANYL-BUPIVACAINE-NACL 0.5-0.125-0.9 MG/250ML-% EP SOLN
12.0000 mL/h | EPIDURAL | Status: DC | PRN
  Filled 2019-01-19: qty 250

## 2019-01-19 MED ORDER — FENTANYL-BUPIVACAINE-NACL 0.5-0.125-0.9 MG/250ML-% EP SOLN: 12.0000 mL/h | EPIDURAL | Status: DC | PRN

## 2019-01-19 MED ORDER — ACETAMINOPHEN 500 MG PO TABS
1000.0000 mg | ORAL_TABLET | Freq: Once | ORAL | Status: AC
  Administered 2019-01-19: 1000 mg via ORAL
  Filled 2019-01-19: qty 2

## 2019-01-19 MED ORDER — ACETAMINOPHEN 325 MG PO TABS: 650.0000 mg | ORAL_TABLET | ORAL | Status: DC | PRN

## 2019-01-19 MED ORDER — LACTATED RINGERS IV SOLN
500.0000 mL | Freq: Once | INTRAVENOUS | Status: AC
  Administered 2019-01-19: 500 mL via INTRAVENOUS

## 2019-01-19 MED ORDER — FENTANYL CITRATE (PF) 100 MCG/2ML IJ SOLN
50.0000 ug | INTRAMUSCULAR | Status: DC | PRN
  Administered 2019-01-19: 100 ug via INTRAVENOUS
  Filled 2019-01-19: qty 2

## 2019-01-19 MED ORDER — SODIUM CHLORIDE (PF) 0.9 % IJ SOLN
INTRAMUSCULAR | Status: DC | PRN
  Administered 2019-01-19: 12 mL/h via EPIDURAL

## 2019-01-19 MED ORDER — PENICILLIN G 3 MILLION UNITS IVPB - SIMPLE MED
3.0000 10*6.[IU] | INTRAVENOUS | Status: DC
  Filled 2019-01-19: qty 100

## 2019-01-19 NOTE — Progress Notes (Signed)
Patient seen and examined.  Feeling some pressure.  BP (!) 112/55   Pulse (!) 113   Temp (!) 101.5 F (38.6 C) (Axillary)   Resp 18    NAD Abd: soft, gravid, 6.5# SVE: 9/100/+1 EFM: 150s, minimal to moderate variability, now with deep variable decels with contractions Toco: q2-3 minutes  A&P:  g1 @    [redacted]w[redacted]d with labor, now with chorioamnionitis Tylenol 1gm now Will start ampicillin and gentamicin Will start amnioinfusion now Will monitor closely.  Discussed with patient that if deep variable decelerations continue, we may need to proceed to OR for cesarean delivery.

## 2019-01-19 NOTE — Anesthesia Procedure Notes (Signed)
Epidural Patient location during procedure: OB Start time: 01/19/2019 5:09 PM End time: 01/19/2019 5:12 PM  Staffing Anesthesiologist: Bethena Midget, MD  Preanesthetic Checklist Completed: patient identified, site marked, surgical consent, pre-op evaluation, timeout performed, IV checked, risks and benefits discussed and monitors and equipment checked  Epidural Patient position: sitting Prep: site prepped and draped and DuraPrep Patient monitoring: continuous pulse ox and blood pressure Approach: midline Location: L3-L4 Injection technique: LOR air  Needle:  Needle type: Tuohy  Needle gauge: 17 G Needle length: 9 cm and 9 Needle insertion depth: 4 cm Catheter type: closed end flexible Catheter size: 19 Gauge Catheter at skin depth: 9 cm Test dose: negative  Assessment Events: blood not aspirated, injection not painful, no injection resistance, negative IV test and no paresthesia

## 2019-01-19 NOTE — MAU Note (Signed)
Sent from office, direct admit

## 2019-01-19 NOTE — Progress Notes (Signed)
ANTIBIOTIC CONSULT NOTE - INITIAL  Pharmacy Consult for Gentamicin Indication: Chorioamnionitis   No Known Allergies  Patient Measurements:    Body Weight: 63.1 kg  Vital Signs: Temp: 101.5 F (38.6 C) (03/20 2249) Temp Source: Axillary (03/20 2249) BP: 112/55 (03/20 2230) Pulse Rate: 113 (03/20 2230)  Labs: Recent Labs    01/19/19 1611  WBC 13.5*  HGB 9.8*  PLT 220   No results for input(s): GENTTROUGH, GENTPEAK, GENTRANDOM in the last 72 hours.   Microbiology: Recent Results (from the past 720 hour(s))  Culture, OB Urine     Status: Abnormal   Collection Time: 01/01/19  8:28 PM  Result Value Ref Range Status   Specimen Description OB CLEAN CATCH  Final   Special Requests NONE  Final   Culture (A)  Final    1,000 COLONIES/mL GROUP B STREP(S.AGALACTIAE)ISOLATED TESTING AGAINST S. AGALACTIAE NOT ROUTINELY PERFORMED DUE TO PREDICTABILITY OF AMP/PEN/VAN SUSCEPTIBILITY. CRITICAL RESULT CALLED TO, READ BACK BY AND VERIFIED WITH: Macy Mis RN, AT 1101 01/04/19 BY Renato Shin Performed at [facility], [address], [place], [place] [address]    Report Status 01/04/2019 FINAL  Final    Medications:  Ampicillin 2gm IV q6hr Penicillin 46mu IV x 1 then 33mu IV q4h  (discontinued when ampicillin started)  Assessment: 23 y.o. female G1P0000 at [redacted]w[redacted]d   Goal of Therapy:  Gentamicin peak 20-25mg /L and Trough < 1 mg/L  Plan:  Gentamicin 320 mg (5mg /kg) IV every 24 hrs  Check Scr with next labs if gentamicin continued. Will check gentamicin levels if continued > 72hr or clinically indicated.  Sherrilyn Rist 01/19/2019,11:29 PM

## 2019-01-19 NOTE — Anesthesia Preprocedure Evaluation (Signed)
Anesthesia Evaluation  Patient identified by MRN, date of birth, ID band Patient awake    Reviewed: Allergy & Precautions, H&P , NPO status , Patient's Chart, lab work & pertinent test results, reviewed documented beta blocker date and time   Airway Mallampati: II  TM Distance: >3 FB Neck ROM: full    Dental no notable dental hx.    Pulmonary neg pulmonary ROS,    Pulmonary exam normal breath sounds clear to auscultation       Cardiovascular negative cardio ROS Normal cardiovascular exam Rhythm:regular Rate:Normal     Neuro/Psych negative neurological ROS  negative psych ROS   GI/Hepatic negative GI ROS, Neg liver ROS,   Endo/Other  negative endocrine ROS  Renal/GU negative Renal ROS  negative genitourinary   Musculoskeletal   Abdominal   Peds  Hematology  (+) Blood dyscrasia, anemia ,   Anesthesia Other Findings   Reproductive/Obstetrics (+) Pregnancy                             Anesthesia Physical Anesthesia Plan  ASA: II  Anesthesia Plan: Epidural   Post-op Pain Management:    Induction:   PONV Risk Score and Plan: 2  Airway Management Planned:   Additional Equipment:   Intra-op Plan:   Post-operative Plan:   Informed Consent: I have reviewed the patients History and Physical, chart, labs and discussed the procedure including the risks, benefits and alternatives for the proposed anesthesia with the patient or authorized representative who has indicated his/her understanding and acceptance.     Dental Advisory Given  Plan Discussed with: CRNA, Anesthesiologist and Surgeon  Anesthesia Plan Comments: (Labs checked- platelets confirmed with RN in room. Fetal heart tracing, per RN, reported to be stable enough for sitting procedure. Discussed epidural, and patient consents to the procedure:  included risk of possible headache,backache, failed block, allergic reaction, and  nerve injury. This patient was asked if she had any questions or concerns before the procedure started.)        Anesthesia Quick Evaluation

## 2019-01-19 NOTE — Discharge Instructions (Signed)
Fetal Movement Counts  Patient Name: ________________________________________________ Patient Due Date: ____________________  What is a fetal movement count?    A fetal movement count is the number of times that you feel your baby move during a certain amount of time. This may also be called a fetal kick count. A fetal movement count is recommended for every pregnant woman. You may be asked to start counting fetal movements as early as week 28 of your pregnancy.  Pay attention to when your baby is most active. You may notice your baby's sleep and wake cycles. You may also notice things that make your baby move more. You should do a fetal movement count:  · When your baby is normally most active.  · At the same time each day.  A good time to count movements is while you are resting, after having something to eat and drink.  How do I count fetal movements?  1. Find a quiet, comfortable area. Sit, or lie down on your side.  2. Write down the date, the start time and stop time, and the number of movements that you felt between those two times. Take this information with you to your health care visits.  3. For 2 hours, count kicks, flutters, swishes, rolls, and jabs. You should feel at least 10 movements during 2 hours.  4. You may stop counting after you have felt 10 movements.  5. If you do not feel 10 movements in 2 hours, have something to eat and drink. Then, keep resting and counting for 1 hour. If you feel at least 4 movements during that hour, you may stop counting.  Contact a health care provider if:  · You feel fewer than 4 movements in 2 hours.  · Your baby is not moving like he or she usually does.  Date: ____________ Start time: ____________ Stop time: ____________ Movements: ____________  Date: ____________ Start time: ____________ Stop time: ____________ Movements: ____________  Date: ____________ Start time: ____________ Stop time: ____________ Movements: ____________  Date: ____________ Start time:  ____________ Stop time: ____________ Movements: ____________  Date: ____________ Start time: ____________ Stop time: ____________ Movements: ____________  Date: ____________ Start time: ____________ Stop time: ____________ Movements: ____________  Date: ____________ Start time: ____________ Stop time: ____________ Movements: ____________  Date: ____________ Start time: ____________ Stop time: ____________ Movements: ____________  Date: ____________ Start time: ____________ Stop time: ____________ Movements: ____________  This information is not intended to replace advice given to you by your health care provider. Make sure you discuss any questions you have with your health care provider.  Document Released: 11/17/2006 Document Revised: 06/16/2016 Document Reviewed: 11/27/2015  Elsevier Interactive Patient Education © 2019 [employer]  Vaginal Delivery    Vaginal delivery means that you give birth by pushing your baby out of your birth canal (vagina). A team of health care providers will help you before, during, and after vaginal delivery. Birth experiences are unique for every woman and every pregnancy, and birth experiences vary depending on where you choose to give birth.  What happens when I arrive at the birth center or hospital?  Once you are in labor and have been admitted into the hospital or birth center, your health care provider may:  · Review your pregnancy history and any concerns that you have.  · Insert an IV into one of your veins. This may be used to give you fluids and medicines.  · Check your blood pressure, pulse, temperature, and heart rate (vital signs).  ·   Check whether your bag of water (amniotic sac) has broken (ruptured).  · Talk with you about your birth plan and discuss pain control options.  Monitoring  Your health care provider may monitor your contractions (uterine monitoring) and your baby's heart rate (fetal monitoring). You may need to be monitored:  · Often, but not continuously  (intermittently).  · All the time or for long periods at a time (continuously). Continuous monitoring may be needed if:  ? You are taking certain medicines, such as medicine to relieve pain or make your contractions stronger.  ? You have pregnancy or labor complications.  Monitoring may be done by:  · Placing a special stethoscope or a handheld monitoring device on your abdomen to check your baby's heartbeat and to check for contractions.  · Placing monitors on your abdomen (external monitors) to record your baby's heartbeat and the frequency and length of contractions.  · Placing monitors inside your uterus through your vagina (internal monitors) to record your baby's heartbeat and the frequency, length, and strength of your contractions. Depending on the type of monitor, it may remain in your uterus or on your baby's head until birth.  · Telemetry. This is a type of continuous monitoring that can be done with external or internal monitors. Instead of having to stay in bed, you are able to move around during telemetry.  Physical exam  Your health care provider may perform frequent physical exams. This may include:  · Checking how and where your baby is positioned in your uterus.  · Checking your cervix to determine:  ? Whether it is thinning out (effacing).  ? Whether it is opening up (dilating).  What happens during labor and delivery?    Normal labor and delivery is divided into the following three stages:  Stage 1  · This is the longest stage of labor.  · This stage can last for hours or days.  · Throughout this stage, you will feel contractions. Contractions generally feel mild, infrequent, and irregular at first. They get stronger, more frequent (about every 2-3 minutes), and more regular as you move through this stage.  · This stage ends when your cervix is completely dilated to 4 inches (10 cm) and completely effaced.  Stage 2  · This stage starts once your cervix is completely effaced and dilated and lasts  until the delivery of your baby.  · This stage may last from 20 minutes to 2 hours.  · This is the stage where you will feel an urge to push your baby out of your vagina.  · You may feel stretching and burning pain, especially when the widest part of your baby's head passes through the vaginal opening (crowning).  · Once your baby is delivered, the umbilical cord will be clamped and cut. This usually occurs after waiting a period of 1-2 minutes after delivery.  · Your baby will be placed on your bare chest (skin-to-skin contact) in an upright position and covered with a warm blanket. Watch your baby for feeding cues, like rooting or sucking, and help the baby to your breast for his or her first feeding.  Stage 3  · This stage starts immediately after the birth of your baby and ends after you deliver the placenta.  · This stage may take anywhere from 5 to 30 minutes.  · After your baby has been delivered, you will feel contractions as your body expels the placenta and your uterus contracts to control bleeding.  What can   I expect after labor and delivery?  · After labor is over, you and your baby will be monitored closely until you are ready to go home to ensure that you are both healthy. Your health care team will teach you how to care for yourself and your baby.  · You and your baby will stay in the same room (rooming in) during your hospital stay. This will encourage early bonding and successful breastfeeding.  · You may continue to receive fluids and medicines through an IV.  · Your uterus will be checked and massaged regularly (fundal massage).  · You will have some soreness and pain in your abdomen, vagina, and the area of skin between your vaginal opening and your anus (perineum).  · If an incision was made near your vagina (episiotomy) or if you had some vaginal tearing during delivery, cold compresses may be placed on your episiotomy or your tear. This helps to reduce pain and swelling.  · You may be given a  squirt bottle to use instead of wiping when you go to the bathroom. To use the squirt bottle, follow these steps:  ? Before you urinate, fill the squirt bottle with warm water. Do not use hot water.  ? After you urinate, while you are sitting on the toilet, use the squirt bottle to rinse the area around your urethra and vaginal opening. This rinses away any urine and blood.  ? Fill the squirt bottle with clean water every time you use the bathroom.  · It is normal to have vaginal bleeding after delivery. Wear a sanitary pad for vaginal bleeding and discharge.  Summary  · Vaginal delivery means that you will give birth by pushing your baby out of your birth canal (vagina).  · Your health care provider may monitor your contractions (uterine monitoring) and your baby's heart rate (fetal monitoring).  · Your health care provider may perform a physical exam.  · Normal labor and delivery is divided into three stages.  · After labor is over, you and your baby will be monitored closely until you are ready to go home.  This information is not intended to replace advice given to you by your health care provider. Make sure you discuss any questions you have with your health care provider.  Document Released: 07/27/2008 Document Revised: 11/22/2017 Document Reviewed: 11/22/2017  Elsevier Interactive Patient Education © 2019 Elsevier Inc.

## 2019-01-19 NOTE — MAU Note (Signed)
Pt called out stating she wanted to go home because she was tired. Pt offered another cervical exam pt deferred.

## 2019-01-19 NOTE — H&P (Signed)
23 y.o. G1P0 @ [redacted]w[redacted]d presents with labor.  Otherwise has good fetal movement and no bleeding.  Pregnancy c/b: 1. Anemia--did not keep initial appointments for iron transfusion.  Last hgb 8.6.  Did ultimately have initial iron transfusion on 3/17.  2. History of genital herpes--on suppressive valtrex.  Denies prodromal symptoms  Past Medical History:  Diagnosis Date  . Anemia    History reviewed. No pertinent surgical history.  OB History  Gravida Para Term Preterm AB Living  1 0 0 0 0 0  SAB TAB Ectopic Multiple Live Births  0 0 0 0 0    # Outcome Date GA Lbr Len/2nd Weight Sex Delivery Anes PTL Lv  1 Current             Social History   Socioeconomic History  . Marital status: Single    Spouse name: Not on file  . Number of children: Not on file  . Years of education: Not on file  . Highest education level: Not on file  Occupational History  . Not on file  Tobacco Use  . Smoking status: Never Smoker  . Smokeless tobacco: Never Used  Substance and Sexual Activity  . Alcohol use: Not on file  . Drug use: Not on file  . Sexual activity: Yes   Patient has no known allergies.    Prenatal Transfer Tool  Maternal Diabetes: No Genetic Screening: Normal Maternal Ultrasounds/Referrals: Normal Fetal Ultrasounds or other Referrals:  None Maternal Substance Abuse:  No Significant Maternal Medications:  Meds include: Other:  Iron, valtrex Significant Maternal Lab Results: Lab values include: Group B Strep positive  ABO, Rh: --/--/A POS (03/20 1611) Antibody: NEG (03/20 1611) Rubella: Immune (10/07 0000) RPR: Nonreactive (10/07 0000)  HBsAg: Negative (10/07 0000)  HIV: Non-reactive (10/07 0000)  GBS:   Positive    There were no vitals filed for this visit.   General:  NAD Abdomen:  soft, gravid Ex:  no edema SSE:  Vulva and vagina without lesions SVE:  6/100/0 per RN FHTs:  130s, moderate variability, + accels, no decels Toco:  q5 minutes   A/P   23 y.o. G1P0  [redacted]w[redacted]d presents with labor Admit to L&D Epidural upon request H/o HSV--no s/sx active outbreak  FSR/ vtx/ GBS positive--PCN  [facility] GEFFEL [employer]

## 2019-01-20 ENCOUNTER — Other Ambulatory Visit: Payer: Self-pay

## 2019-01-20 ENCOUNTER — Encounter (HOSPITAL_COMMUNITY): Payer: Self-pay

## 2019-01-20 LAB — CBC
HCT: 30.5 % — ABNORMAL LOW (ref 36.0–46.0)
Hemoglobin: 9.5 g/dL — ABNORMAL LOW (ref 12.0–15.0)
MCH: 27.4 pg (ref 26.0–34.0)
MCHC: 31.1 g/dL (ref 30.0–36.0)
MCV: 87.9 fL (ref 80.0–100.0)
Platelets: 204 10*3/uL (ref 150–400)
RBC: 3.47 MIL/uL — ABNORMAL LOW (ref 3.87–5.11)
RDW: 16.9 % — ABNORMAL HIGH (ref 11.5–15.5)
WBC: 16.1 10*3/uL — ABNORMAL HIGH (ref 4.0–10.5)
nRBC: 0 % (ref 0.0–0.2)

## 2019-01-20 LAB — RPR: RPR: NONREACTIVE

## 2019-01-20 MED ORDER — TETANUS-DIPHTH-ACELL PERTUSSIS 5-2.5-18.5 LF-MCG/0.5 IM SUSP: 0.5000 mL | Freq: Once | INTRAMUSCULAR | Status: DC

## 2019-01-20 MED ORDER — COCONUT OIL OIL: 1.0000 "application " | TOPICAL_OIL | Status: DC | PRN

## 2019-01-20 MED ORDER — SENNOSIDES-DOCUSATE SODIUM 8.6-50 MG PO TABS
2.0000 | ORAL_TABLET | ORAL | Status: DC
  Administered 2019-01-21 (×2): 2 via ORAL
  Filled 2019-01-20 (×2): qty 2

## 2019-01-20 MED ORDER — DIPHENHYDRAMINE HCL 25 MG PO CAPS: 25.0000 mg | ORAL_CAPSULE | Freq: Four times a day (QID) | ORAL | Status: DC | PRN

## 2019-01-20 MED ORDER — OXYCODONE HCL 5 MG PO TABS: 10.0000 mg | ORAL_TABLET | ORAL | Status: DC | PRN

## 2019-01-20 MED ORDER — IBUPROFEN 800 MG PO TABS
800.0000 mg | ORAL_TABLET | Freq: Once | ORAL | Status: AC
  Administered 2019-01-20: 800 mg via ORAL
  Filled 2019-01-20: qty 1

## 2019-01-20 MED ORDER — BENZOCAINE-MENTHOL 20-0.5 % EX AERO: 1.0000 "application " | INHALATION_SPRAY | CUTANEOUS | Status: DC | PRN

## 2019-01-20 MED ORDER — DIBUCAINE 1 % RE OINT: 1.0000 "application " | TOPICAL_OINTMENT | RECTAL | Status: DC | PRN

## 2019-01-20 MED ORDER — IBUPROFEN 600 MG PO TABS
600.0000 mg | ORAL_TABLET | Freq: Four times a day (QID) | ORAL | Status: DC
  Administered 2019-01-20 – 2019-01-22 (×6): 600 mg via ORAL
  Filled 2019-01-20 (×7): qty 1

## 2019-01-20 MED ORDER — SIMETHICONE 80 MG PO CHEW: 80.0000 mg | CHEWABLE_TABLET | ORAL | Status: DC | PRN

## 2019-01-20 MED ORDER — WITCH HAZEL-GLYCERIN EX PADS: 1.0000 "application " | MEDICATED_PAD | CUTANEOUS | Status: DC | PRN

## 2019-01-20 MED ORDER — ONDANSETRON HCL 4 MG PO TABS: 4.0000 mg | ORAL_TABLET | ORAL | Status: DC | PRN

## 2019-01-20 MED ORDER — ACETAMINOPHEN 325 MG PO TABS
650.0000 mg | ORAL_TABLET | ORAL | Status: DC | PRN
  Administered 2019-01-21: 650 mg via ORAL
  Filled 2019-01-20 (×2): qty 2

## 2019-01-20 MED ORDER — ONDANSETRON HCL 4 MG/2ML IJ SOLN: 4.0000 mg | INTRAMUSCULAR | Status: DC | PRN

## 2019-01-20 MED ORDER — PRENATAL MULTIVITAMIN CH
1.0000 | ORAL_TABLET | Freq: Every day | ORAL | Status: DC
  Administered 2019-01-20 – 2019-01-21 (×2): 1 via ORAL
  Filled 2019-01-20 (×2): qty 1

## 2019-01-20 MED ORDER — OXYCODONE HCL 5 MG PO TABS: 5.0000 mg | ORAL_TABLET | ORAL | Status: DC | PRN

## 2019-01-20 MED ORDER — SODIUM CHLORIDE 0.9 % IV SOLN: 510.0000 mg | INTRAVENOUS | Status: DC

## 2019-01-20 NOTE — Lactation Note (Addendum)
This note was copied from a baby's chart. Lactation Consultation Note  Patient Name: Caitlin Bauer [id number] Date: 01/20/2019   Infant 15 hours old. NICU RN requested my assistance with latching. By the time of my arrival, infant was sleeping and did not show any interest in latching. Mom's nipples appear flat at rest, but she reports that they become more erect when cold, etc. I anticipate that infant could latch using the "teacup" hold when more alert.  Mom reported + breast changes w/pregnancy. Hand expression was taught to Mom & a tiny glisten was noted. Mom's nipple was noted to become more erect with hand expression.   Breastfeeding brochure to be provided at next visit.    Lurline Hare [facility] 01/20/2019, 7:46 PM

## 2019-01-21 NOTE — Lactation Note (Signed)
This note was copied from a baby's chart. Lactation Consultation Note  Patient Name: Caitlin Bauer [id number] Date: 01/21/2019   RN requested assistance at infants bed for a feeding.  Mom reports she has only pumped 1 time today.  Urged pumping 8-12 times day for 15 minutes until infant bf well. Assist with latching infant.  In teacup hold she can maintain for a few sucks but unable to maintain more then a few.  Mom has flat appearing nipples that do evert with stimulation and hand expression.  Able to hand express drops.  Infant rooting and showing interest. After a few attempts  And infant cueing and getting fussy, apply 24 mm nipple shield.  Infant latched and breastfed well.  Milk in shield when infant came off.  Urged mom to try her at next feeding without the nipple shield.  Mom went and got her pump parts so she could pump at babies bedside. Urged mom to breastfeed as soon as she shows cues and continue to pump past breastfeeds or anytime she does not breastfeed until bf well.  Urged mom to call lactation as needed   Maternal Data    Feeding Feeding Type: Bottle Fed - Formula  [facility] Score                   Interventions    Lactation Tools Discussed/Used     Consult Status      Neomia Dear 01/21/2019, 8:31 PM

## 2019-01-21 NOTE — Progress Notes (Signed)
Patient is doing well.  She is ambulating, voiding, tolerating PO.  Pain control is good.  Lochia is appropriate.  Vitals:   01/20/19 1319 01/20/19 1345 01/20/19 2257 01/21/19 0642  BP: (!) 97/57 100/68 102/61 (!) 92/53  Pulse: 89 83 73 85  Resp: 16 16 18 14   Temp: 97.7 F (36.5 C) 98.9 F (37.2 C) 99.2 F (37.3 C) 97.8 F (36.6 C)  TempSrc: Oral Oral Oral Oral  SpO2:  100%  98%  Weight:      Height:        NAD Fundus firm Ext: no edema  Lab Results  Component Value Date   WBC 16.1 (H) 01/20/2019   HGB 9.5 (L) 01/20/2019   HCT 30.5 (L) 01/20/2019   MCV 87.9 01/20/2019   PLT 204 01/20/2019    --/--/B POS, B POS Performed at [facility], [address], [place], [place] [address]  (03/20 1611)/RImmune  A/P 23 y.o. G1P1001 PPD#1. Routine care.   Expect d/c tomorrow Baby doing well in NICU--r/o sepsis work up.    [facility] [employer]

## 2019-01-21 NOTE — Anesthesia Postprocedure Evaluation (Signed)
Anesthesia Post Note  Patient: Caitlin Bauer  Procedure(s) Performed: AN AD HOC LABOR EPIDURAL     Patient location during evaluation: Mother Baby Anesthesia Type: Epidural Level of consciousness: awake and alert Pain management: pain level controlled Vital Signs Assessment: post-procedure vital signs reviewed and stable Respiratory status: spontaneous breathing, nonlabored ventilation and respiratory function stable Cardiovascular status: stable Postop Assessment: no headache, no backache and epidural receding Anesthetic complications: no    Last Vitals:  Vitals:   01/20/19 2257 01/21/19 0642  BP: 102/61 (!) 92/53  Pulse: 73 85  Resp: 18 14  Temp: 37.3 C 36.6 C  SpO2:  98%    Last Pain:  Vitals:   01/21/19 0642  TempSrc: Oral  PainSc:    Pain Goal:                   Caitlin Bauer

## 2019-01-22 ENCOUNTER — Ambulatory Visit: Payer: Self-pay

## 2019-01-22 MED ORDER — PRENATAL MULTIVITAMIN CH
1.0000 | ORAL_TABLET | Freq: Every day | ORAL | 1 refills | Status: AC
Start: 2019-01-22 — End: ?

## 2019-01-22 NOTE — Lactation Note (Signed)
This note was copied from a Caitlin's chart. Lactation Consultation Note  Patient Name: Caitlin Bauer [id number] Date: 01/22/2019  Caitlin Bauer now 55 hours being d/c today with mom.  Mom reports she has not tried to breastfeed her since last night but plans to breastfeed once she gets home.  Mom has hand pump for use at home but does not have DEBP.  Mom on [facility] in [place].  Urged mom to contact [facility] office regarding DEBP.  Urged mom to breastfeed on cue and 8 or more times day.  Parents excited to go home.  Deny need at this time.  Mom has all her pump parts also.  Urged her to call lactation as needed.   Maternal Data    Feeding    LATCH Score                   Interventions    Lactation Tools Discussed/Used     Consult Status      Caitlin Bauer 01/22/2019, 2:08 PM

## 2019-01-22 NOTE — Discharge Summary (Signed)
Obstetric Discharge Summary Reason for Admission: onset of labor Prenatal Procedures: ultrasound Intrapartum Procedures: spontaneous vaginal delivery Postpartum Procedures: none Complications-Operative and Postpartum: chorioamnionitis Hemoglobin  Date Value Ref Range Status  01/20/2019 9.5 (L) 12.0 - 15.0 g/dL Final   HCT  Date Value Ref Range Status  01/20/2019 30.5 (L) 36.0 - 46.0 % Final    Physical Exam:  General: alert and cooperative Lochia: appropriate   Discharge Diagnoses: Term Pregnancy-delivered, Amnionitis and history of HSV  Discharge Information: Date: 01/22/2019 Diet: routine Medications: PNV and Iron Condition: stable Instructions: refer to practice specific booklet Discharge to: home Follow-up Information    Marlow Baars, MD. Schedule an appointment as soon as possible for a visit in 1 month(s).   Specialty:  Obstetrics Contact information: [address] [place] [address] [phone]           Newborn Data: Live born female  Birth Weight: 6 lb 8.8 oz (2970 g) APGAR: 5, 8  Newborn Delivery   Birth date/time:  01/20/2019 03:32:00 Delivery type:  Vaginal, Spontaneous     Home with in NICU.  Katora Fini E 01/22/2019, 9:31 AM

## 2019-01-22 NOTE — Progress Notes (Signed)
PPD#2 Pt without complaints. Lochia mild, Baby on abxs in NICU. VSSAF hgb-9.5 IMP/ Doing well Plan/ Will discharge, If baby ends up going home tomorrow will cancel discharge today

## 2019-01-23 ENCOUNTER — Inpatient Hospital Stay (HOSPITAL_COMMUNITY): Admission: RE | Admit: 2019-01-23 | Payer: Medicaid Other | Source: Ambulatory Visit

## 2019-04-11 ENCOUNTER — Other Ambulatory Visit: Payer: Self-pay | Admitting: Registered Nurse

## 2019-04-11 ENCOUNTER — Inpatient Hospital Stay (HOSPITAL_COMMUNITY)
Admission: AD | Admit: 2019-04-11 | Discharge: 2019-04-12 | DRG: 776 | Disposition: A | Discharge status: Home / Self Care | Payer: Medicaid Other | Source: Other Acute Inpatient Hospital | Attending: Psychiatry | Admitting: Psychiatry

## 2019-04-11 DIAGNOSIS — F322 Major depressive disorder, single episode, severe without psychotic features: Secondary | ICD-10-CM | POA: Diagnosis not present

## 2019-04-11 DIAGNOSIS — O99345 Other mental disorders complicating the puerperium: Secondary | ICD-10-CM | POA: Diagnosis present

## 2019-04-11 DIAGNOSIS — F53 Postpartum depression: Secondary | ICD-10-CM | POA: Diagnosis present

## 2019-04-11 DIAGNOSIS — G4700 Insomnia, unspecified: Secondary | ICD-10-CM | POA: Diagnosis present

## 2019-04-11 MED ORDER — HYDROXYZINE HCL 25 MG PO TABS: 25.0000 mg | ORAL_TABLET | Freq: Four times a day (QID) | ORAL | Status: DC | PRN

## 2019-04-11 MED ORDER — MAGNESIUM HYDROXIDE 400 MG/5ML PO SUSP: 30.0000 mL | Freq: Every day | ORAL | Status: DC | PRN

## 2019-04-11 MED ORDER — ACETAMINOPHEN 325 MG PO TABS: 650.0000 mg | ORAL_TABLET | Freq: Four times a day (QID) | ORAL | Status: DC | PRN

## 2019-04-11 MED ORDER — CEPHALEXIN 500 MG PO CAPS
500.0000 mg | ORAL_CAPSULE | Freq: Two times a day (BID) | ORAL | Status: DC
  Administered 2019-04-12: 500 mg via ORAL
  Filled 2019-04-11 (×6): qty 1

## 2019-04-11 MED ORDER — TRAZODONE HCL 50 MG PO TABS: 50.0000 mg | ORAL_TABLET | Freq: Every evening | ORAL | Status: DC | PRN

## 2019-04-11 MED ORDER — ALUM & MAG HYDROXIDE-SIMETH 200-200-20 MG/5ML PO SUSP: 30.0000 mL | Freq: Four times a day (QID) | ORAL | Status: DC | PRN

## 2019-04-11 NOTE — BH Assessment (Signed)
Assessment Note Assessment completed by Reather LaurenceEugene Naughton and entered by Carron CurieKendall Merl Guardino  Caitlin Bauer is an 23 y.o. female. ho presented to [facility] on voluntary basis with complaint of suicidal ideation and suicide attempt.  Pt lives in [place] with her 742 month old child and the child's father.  Pt reported that she works, and that she does not receive outpatient psychiatric services.  Pt has not been assessed by TTS before.  Per hospital report:  Patient states that since her daughter was born in March she has been having issues at home, financially, and with the baby's father.  Patient states "I'm tired." states that she is tired of breathing.  Patient was in the kitchen trying to cut her wrist and end her life today but her daughter's father walked in and stopped her.  Has extremely superficial 2 cm laceration to left volar wrist.    Pt reported to author that she tried to kill herself by cutting her wrists, but that she was interrupted by the father of her two month old child.  Pt also reported that she attempted suicide a month ago by similar means.  Pt endorsed despondency, isolation, low energy, and feelings of hopelessness.  She denied homicidal ideation, hallucination, and self-injurious behavior.  Pt endorsed daily use of marijuana (about a blunt a day).  Pt also endorsed financial stressors.  When asked about safety at home, Pt declined to answer whether she felt safe at home.    Pt reported that she has been depressed for longer than two months, and also that she was treated for depression when she was younger.  Pt indicated that she is ''very tired'' of living, but also that she does not want to abandon her child.  During assessment, Pt presented as alert and oriented.  She had good eye contact.  Demeanor was guarded.  Pt's mood was depressed.  Affect was blunted.  Pt's speech was normal in rate, rhythm, and volume.  Thought processes were within normal range, and thought content was  logical and goal-oriented.  There was no evidence of delusion.  Pt's memory and concentration were intact.  Insight, judgment, and impulse control were poor.    Diagnosis: Major Depressive Disorder, Single Ep., Severe, w/o psychotic features  Past Medical History:  Past Medical History:  Diagnosis Date  . Anemia     No past surgical history on file.  Family History: No family history on file.  Social History:  reports that she has never smoked. She has never used smokeless tobacco. No history on file for alcohol and drug.  Additional Social History:  Alcohol / Drug Use Pain Medications: See MAR Prescriptions: See MAR Over the Counter: See MAR History of alcohol / drug use?: Yes Substance #1 Name of Substance 1: Marijuana 1 - Age of First Use: unknown 1 - Amount (size/oz): one blunt 1 - Frequency: daily 1 - Last Use / Amount: unknown  CIWA:   COWS:    Allergies: No Known Allergies  Home Medications:  No medications prior to admission.    OB/GYN Status:  No LMP recorded.  General Assessment Data [facility] of Assessment[facility] TTS Assessment: Out of system Is this a Tele or Face-to-Face Assessment?: Tele Assessment Is this an Initial Assessment or a Re-assessment for this encounter?: Initial Assessment Patient Accompanied by:: N/A Language Other than English: No Living Arrangements: Other (Comment)(home) What gender do you identify as?: Female Marital status: Single Pregnancy Status: No Living Arrangements: Children, Spouse/significant other  Can pt return to current living arrangement?: Yes Admission Status: Voluntary Is patient capable of signing voluntary admission?: Yes Referral Source: Self/Family/Friend Insurance type: Medicaid     Crisis Care Plan Living Arrangements: Children, Spouse/significant other Legal Guardian: Other:(Self) Name of Psychiatrist: none Name of Therapist: none  Education Status Is patient currently in school?:  No Is the patient employed, unemployed or receiving disability?: Employed  Risk to self with the past 6 months Suicidal Ideation: Yes-Currently Present Has patient been a risk to self within the past 6 months prior to admission? : Yes Suicidal Intent: Yes-Currently Present Has patient had any suicidal intent within the past 6 months prior to admission? : Yes Is patient at risk for suicide?: Yes Suicidal Plan?: Yes-Currently Present Has patient had any suicidal plan within the past 6 months prior to admission? : Yes Specify Current Suicidal Plan: cut self Access to Means: Yes Specify Access to Suicidal Means: knives at home What has been your use of drugs/alcohol within the last 12 months?: marijuana Previous Attempts/Gestures: No How many times?: 0 Other Self Harm Risks: none Triggers for Past Attempts: None known Intentional Self Injurious Behavior: None Family Suicide History: Unknown Recent stressful life event(s): Financial Problems Persecutory voices/beliefs?: No Depression: Yes Depression Symptoms: Despondent, Isolating, Loss of interest in usual pleasures, Feeling worthless/self pity Substance abuse history and/or treatment for substance abuse?: Yes Suicide prevention information given to non-admitted patients: Not applicable  Risk to Others within the past 6 months Homicidal Ideation: No Does patient have any lifetime risk of violence toward others beyond the six months prior to admission? : No Thoughts of Harm to Others: No Current Homicidal Intent: No Current Homicidal Plan: No Access to Homicidal Means: No Identified Victim: pt denies History of harm to others?: No Assessment of Violence: None Noted Violent Behavior Description: pt denies Does patient have access to weapons?: No Criminal Charges Pending?: No Does patient have a court date: No Is patient on probation?: No  Psychosis Hallucinations: None noted Delusions: None noted  Mental Status  Report Appearance/Hygiene: Unremarkable Eye Contact: Good Motor Activity: Unremarkable Speech: Unremarkable, Logical/coherent Level of Consciousness: Alert Mood: Depressed Affect: Blunted Anxiety Level: None Thought Processes: Coherent, Relevant Judgement: Impaired Orientation: Person, Place, Time, Situation Obsessive Compulsive Thoughts/Behaviors: None  Cognitive Functioning Concentration: Normal Memory: Recent Intact, Remote Intact Is patient IDD: No Insight: Poor Impulse Control: Poor Appetite: Fair Have you had any weight changes? : No Change Sleep: No Change Vegetative Symptoms: None  ADLScreening [facility] Assessment Services) Patient's cognitive ability adequate to safely complete daily activities?: Yes Patient able to express need for assistance with ADLs?: Yes Independently performs ADLs?: Yes (appropriate for developmental age)  Prior Inpatient Therapy Prior Inpatient Therapy: No  Prior Outpatient Therapy Prior Outpatient Therapy: No Does patient have an ACCT team?: No Does patient have Intensive In-House Services?  : No Does patient have [facility] services? : No Does patient have P4CC services?: No  ADL Screening (condition at time of admission) Patient's cognitive ability adequate to safely complete daily activities?: Yes Patient able to express need for assistance with ADLs?: Yes Independently performs ADLs?: Yes (appropriate for developmental age)       Abuse/Neglect Assessment (Assessment to be complete while patient is alone) Abuse/Neglect Assessment Can Be Completed: Yes Physical Abuse: Denies Verbal Abuse: Denies Sexual Abuse: Denies Exploitation of patient/patient's resources: Denies Self-Neglect: Denies Values / Beliefs Cultural Requests During Hospitalization: None Spiritual Requests During Hospitalization: None Consults Spiritual Care Consult Needed: No Social Work Consult Needed: No  Disposition:  Disposition Initial  Assessment Completed for this Encounter: Yes Disposition of Patient: Admit Type of inpatient treatment program: Adult  On Site Evaluation by:   Reviewed with Physician:    Enzo Montgomery 04/11/2019 6:09 PM

## 2019-04-12 ENCOUNTER — Other Ambulatory Visit: Payer: Self-pay

## 2019-04-12 ENCOUNTER — Encounter (HOSPITAL_COMMUNITY): Payer: Self-pay

## 2019-04-12 DIAGNOSIS — F322 Major depressive disorder, single episode, severe without psychotic features: Secondary | ICD-10-CM

## 2019-04-12 MED ORDER — CEPHALEXIN 500 MG PO CAPS: 500.0000 mg | ORAL_CAPSULE | Freq: Two times a day (BID) | ORAL | 1 refills | Status: AC

## 2019-04-12 MED ORDER — ESCITALOPRAM OXALATE 20 MG PO TABS: 20.0000 mg | ORAL_TABLET | Freq: Every day | ORAL | 2 refills | Status: AC

## 2019-04-12 NOTE — Progress Notes (Addendum)
Admission note:  Patient admitted from [facility] voluntarily. Per report: she went to [place] today with complaints of SI and a self inflicted laceration to left wrist. Laceration secured with dermabond, no stitches, and wrapped. Patient is three months post partum. No known allergies. Stressors include issues at home with money and the baby's father. Hx asthma. UDS positive for THC. Keflex for UTI. A&O.  During admission assessment by this RN patient denies SI, HI, AVH, and contracts for safety. She reports that when she cut her wrist she was trying to kill herself. Dressing is clean dry and intact. Reports she takes a medication for post partum depression but can't remember the name. No pain, no alcohol use, no tobacco use. Admits to marijuana use to help with sleep. Low fall risk. No abuse history. No history of being aggressive. Complaints of depression, agitation, hopelessness, loneliness, and helplessness. Support system includes her mother, brother, and sister. She lives with her two month old daughter and her daughters father. She works a Engineer, materials. Stressors are money and relationship problems. She wants to work on self control, getting through depression/SI, and self love.   Patient oriented to the unit. Skin assessment only significant for wrapped left wrist laceration. Purse, cell phone, car key, and debit card placed in locker. Clothes at bedside. Provided with sandwich tray, drink, and toiletries. Her rights and responsibilities were described to her. Patient said she felt tired and did not want any sleep medication. Patient asked if she had to stay three days and it was explained to her that her length of stay was decided by the doctor. She signed a 72 hour request for discharge at midnight.

## 2019-04-12 NOTE — Discharge Summary (Signed)
Physician Discharge Summary Note  Patient:  Caitlin Bauer is an 23 y.o., female MRN:  [id number] DOB:  08/20/1996 Patient phone:  [phone] (home)  Patient address:   [address] [place] [address],  Total Time spent with patient: 45 minutes  Date of Admission:  04/11/2019 Date of Discharge: 04/12/2019  Reason for Admission:   History of Present Illness: Caitlin Bauer is 24 years of age she has a 51-month-old at home she has some postpartum depressive symptoms she felt very overwhelmed had an argument with her boyfriend and made a superficial cut to her left wrist that did not require sutures but has Steri-Strips on it.  She states she was just overwhelmed for the moment she does not have suicidal thoughts plans or intent does not have thoughts of harming her child of course is eager to get back to her child misses her badly.  She can contract fully she can remain on her Lexapro for depressive symptoms   Principal Problem: Self-harm gesture in the context of feeling overwhelmed Discharge Diagnoses: Active Problems:   MDD (major depressive disorder), severe (HCC)  Past Medical History:  Past Medical History:  Diagnosis Date  . Anemia    History reviewed. No pertinent surgical history. Family History: History reviewed. No pertinent family history. Family Psychiatric  History: neg Social History:  Social History   Substance and Sexual Activity  Alcohol Use Not Currently     Social History   Substance and Sexual Activity  Drug Use Yes  . Types: Marijuana    Social History   Socioeconomic History  . Marital status: Single    Spouse name: Not on file  . Number of children: Not on file  . Years of education: Not on file  . Highest education level: Not on file  Occupational History  . Not on file  Social Needs  . Financial resource strain: Not on file  . Food insecurity    Worry: Not on file    Inability: Not on file  . Transportation needs    Medical: Not on  file    Non-medical: Not on file  Tobacco Use  . Smoking status: Never Smoker  . Smokeless tobacco: Never Used  Substance and Sexual Activity  . Alcohol use: Not Currently  . Drug use: Yes    Types: Marijuana  . Sexual activity: Yes  Lifestyle  . Physical activity    Days per week: Not on file    Minutes per session: Not on file  . Stress: Not on file  Relationships  . Social Herbalist on phone: Not on file    Gets together: Not on file    Attends religious service: Not on file    Active member of club or organization: Not on file    Attends meetings of clubs or organizations: Not on file    Relationship status: Not on file  Other Topics Concern  . Not on file  Social History Narrative  . Not on file    Hospital Course:    Patient was always pleasant and cooperative did not sleep well her first night by the morning of the 11th she was alert oriented cooperative she spoke with her mother who agreed to help her with her newborn and so forth, she does not have suicidal thoughts plans or intent she can contract fully is eager to get home to her newborn.  She is again interviewed in a full mental status exam found  to be safe for discharge again no thoughts of harming self or others contracting fully no obsessive-compulsive symptoms. She reports she was on S-Citalopram at the lower dose we will escalated to 20 mg a day at discharge She understands that antidepressants may escalate the occurrence of suicidal thinking in younger people so she will call and discontinue the agent if this happens  Physical Findings: AIMS: Facial and Oral Movements Muscles of Facial Expression: None, normal Lips and Perioral Area: None, normal Jaw: None, normal Tongue: None, normal,Extremity Movements Upper (arms, wrists, hands, fingers): None, normal Lower (legs, knees, ankles, toes): None, normal, Trunk Movements Neck, shoulders, hips: None, normal, Overall Severity Severity of abnormal  movements (highest score from questions above): None, normal Incapacitation due to abnormal movements: None, normal Patient's awareness of abnormal movements (rate only patient's report): No Awareness, Dental Status Current problems with teeth and/or dentures?: No Does patient usually wear dentures?: No  CIWA:    COWS:      Psychiatric Specialty Exam: ROS  Blood pressure 99/66, pulse 68, temperature 98.4 F (36.9 C), temperature source Oral, resp. rate 16, SpO2 100 %, unknown if currently breastfeeding.There is no height or weight on file to calculate BMI.  General Appearance:Casual  Eye Contact::Good  Speech:Clear and Coherent409  Volume:Normal  Mood:Dysphoric  Affect:Full Range  Thought Process:Coherent and Descriptions of Associations:Intact  Orientation:Full (Time, Place, and Person)  Thought Content:Logical  Suicidal Thoughts:No  Homicidal Thoughts:No  Memory:Immediate;Good  Judgement:Good  Insight:Good  Psychomotor Activity:Normal  Concentration:Good  Recall:Good  Fund of Knowledge:Good  Language:Good  Akathisia:Negative  Handed:Right  AIMS (if indicated):   Assets:Communication Skills Desire for Improvement Financial Resources/Insurance Housing Leisure Time Physical Health Resilience Talents/Skills  Sleep:Number of Hours: 3.5  Cognition:WNL  ADL's:Intact        Have you used any form of tobacco in the last 30 days? (Cigarettes, Smokeless Tobacco, Cigars, and/or Pipes): No  Has this patient used any form of tobacco in the last 30 days? (Cigarettes, Smokeless Tobacco, Cigars, and/or Pipes) Yes, No  Blood Alcohol level:  No results found for: [facility]  Metabolic Disorder Labs:  No results found for: HGBA1C, MPG No results found for: PROLACTIN No results found for: CHOL, TRIG, HDL, CHOLHDL, VLDL, LDLCALC  See Psychiatric Specialty Exam and Suicide Risk Assessment completed by Attending Physician prior to  discharge.  Discharge destination:  Home  Is patient on multiple antipsychotic therapies at discharge:  No   Has Patient had three or more failed trials of antipsychotic monotherapy by history:  No  Recommended Plan for Multiple Antipsychotic Therapies: NA   Allergies as of 04/12/2019   No Known Allergies     Medication List    TAKE these medications     Indication  cephALEXin 500 MG capsule Commonly known as: KEFLEX Take 1 capsule (500 mg total) by mouth 2 (two) times daily.  Indication: Infection of the Skin and/or Skin Structures, UTI   escitalopram 20 MG tablet Commonly known as: Lexapro Take 1 tablet (20 mg total) by mouth daily.  Indication: Major Depressive Disorder   ferrous sulfate 325 (65 FE) MG EC tablet Take 325 mg by mouth 3 (three) times daily with meals.  Indication: Iron Deficiency   prenatal multivitamin Tabs tablet Take 1 tablet by mouth daily at 12 noon.  Indication: LACTATION       SignedMalvin Johns: Gaetano Romberger, MD 04/12/2019, 9:25 AM

## 2019-04-12 NOTE — Tx Team (Signed)
Initial Treatment Plan 04/12/2019 3:44 AM Caitlin Bauer WPV:[id number]    PATIENT STRESSORS: Financial difficulties Marital or family conflict   PATIENT STRENGTHS: Average or above average intelligence Communication skills Physical Health Supportive family/friends   PATIENT IDENTIFIED PROBLEMS: "self control" "self love"                     DISCHARGE CRITERIA:  Improved stabilization in mood, thinking, and/or behavior Need for constant or close observation no longer present Reduction of life-threatening or endangering symptoms to within safe limits  PRELIMINARY DISCHARGE PLAN: Return to previous living arrangement  PATIENT/FAMILY INVOLVEMENT: This treatment plan has been presented to and reviewed with the patient, Caitlin Bauer.  The patient and family have been given the opportunity to ask questions and make suggestions.  Noel Christmas, RN 04/12/2019, 3:44 AM

## 2019-04-12 NOTE — Plan of Care (Signed)
Discharge note  Patient verbalizes readiness for discharge. Follow up plan explained, AVS, Transition record and SRA given. Prescriptions and teaching provided. Belongings returned and signed for. Suicide safety plan completed and signed. Patient verbalizes understanding. Patient denies SI/HI and assures this Clinical research associatewriter he will seek assistance should that change. Patient discharged to lobby where mother was waiting.  Problem: Education: Goal: Knowledge of [facility] Education information/materials will improve Outcome: Adequate for Discharge Goal: Emotional status will improve Outcome: Adequate for Discharge Goal: Mental status will improve Outcome: Adequate for Discharge Goal: Verbalization of understanding the information provided will improve Outcome: Adequate for Discharge   Problem: Activity: Goal: Interest or engagement in activities will improve Outcome: Adequate for Discharge Goal: Sleeping patterns will improve Outcome: Adequate for Discharge   Problem: Coping: Goal: Ability to verbalize frustrations and anger appropriately will improve Outcome: Adequate for Discharge Goal: Ability to demonstrate self-control will improve Outcome: Adequate for Discharge   Problem: Health Behavior/Discharge Planning: Goal: Identification of resources available to assist in meeting health care needs will improve Outcome: Adequate for Discharge Goal: Compliance with treatment plan for underlying cause of condition will improve Outcome: Adequate for Discharge   Problem: Physical Regulation: Goal: Ability to maintain clinical measurements within normal limits will improve Outcome: Adequate for Discharge   Problem: Safety: Goal: Periods of time without injury will increase Outcome: Adequate for Discharge   Problem: Education: Goal: Utilization of techniques to improve thought processes will improve Outcome: Adequate for Discharge Goal: Knowledge of the prescribed therapeutic regimen  will improve Outcome: Adequate for Discharge   Problem: Activity: Goal: Interest or engagement in leisure activities will improve Outcome: Adequate for Discharge Goal: Imbalance in normal sleep/wake cycle will improve Outcome: Adequate for Discharge   Problem: Coping: Goal: Coping ability will improve Outcome: Adequate for Discharge Goal: Will verbalize feelings Outcome: Adequate for Discharge   Problem: Health Behavior/Discharge Planning: Goal: Ability to make decisions will improve Outcome: Adequate for Discharge Goal: Compliance with therapeutic regimen will improve Outcome: Adequate for Discharge   Problem: Role Relationship: Goal: Will demonstrate positive changes in social behaviors and relationships Outcome: Adequate for Discharge   Problem: Safety: Goal: Ability to disclose and discuss suicidal ideas will improve Outcome: Adequate for Discharge Goal: Ability to identify and utilize support systems that promote safety will improve Outcome: Adequate for Discharge   Problem: Self-Concept: Goal: Will verbalize positive feelings about self Outcome: Adequate for Discharge Goal: Level of anxiety will decrease Outcome: Adequate for Discharge   Problem: Education: Goal: Ability to make informed decisions regarding treatment will improve Outcome: Adequate for Discharge   Problem: Coping: Goal: Coping ability will improve Outcome: Adequate for Discharge   Problem: Health Behavior/Discharge Planning: Goal: Identification of resources available to assist in meeting health care needs will improve Outcome: Adequate for Discharge   Problem: Medication: Goal: Compliance with prescribed medication regimen will improve Outcome: Adequate for Discharge   Problem: Self-Concept: Goal: Ability to disclose and discuss suicidal ideas will improve Outcome: Adequate for Discharge Goal: Will verbalize positive feelings about self Outcome: Adequate for Discharge   Problem:  Activity: Goal: Will identify at least one activity in which they can participate Outcome: Adequate for Discharge   Problem: Coping: Goal: Ability to identify and develop effective coping behavior will improve Outcome: Adequate for Discharge Goal: Ability to interact with others will improve Outcome: Adequate for Discharge Goal: Demonstration of participation in decision-making regarding own care will improve Outcome: Adequate for Discharge Goal: Ability to use eye contact when communicating  with others will improve Outcome: Adequate for Discharge   Problem: Health Behavior/Discharge Planning: Goal: Identification of resources available to assist in meeting health care needs will improve Outcome: Adequate for Discharge   Problem: Self-Concept: Goal: Will verbalize positive feelings about self Outcome: Adequate for Discharge

## 2019-04-12 NOTE — [facility] Suicide Risk Assessment (Signed)
[facility] Discharge Suicide Risk Assessment   Principal Problem: Post partum depression Discharge Diagnoses: Active Problems:   MDD (major depressive disorder), severe ([place])   Total Time spent with patient: 45 minutes  Musculoskeletal: Strength & Muscle Tone: within normal limits Gait & Station: normal Patient leans: N/A  Psychiatric Specialty Exam: ROS  Blood pressure 99/66, pulse 68, temperature 98.4 F (36.9 C), temperature source Oral, resp. rate 16, SpO2 100 %, unknown if currently breastfeeding.There is no height or weight on file to calculate BMI.  General Appearance: Casual  Eye Contact::  Good  Speech:  Clear and Coherent409  Volume:  Normal  Mood:  Dysphoric  Affect:  Full Range  Thought Process:  Coherent and Descriptions of Associations: Intact  Orientation:  Full (Time, Place, and Person)  Thought Content:  Logical  Suicidal Thoughts:  No  Homicidal Thoughts:  No  Memory:  Immediate;   Good  Judgement:  Good  Insight:  Good  Psychomotor Activity:  Normal  Concentration:  Good  Recall:  Good  Fund of Knowledge:Good  Language: Good  Akathisia:  Negative  Handed:  Right  AIMS (if indicated):     Assets:  Communication Skills Desire for Improvement Financial Resources/Insurance Housing Leisure Time Physical Health Resilience Talents/Skills  Sleep:  Number of Hours: 3.5  Cognition: WNL  ADL's:  Intact   Mental Status Per Nursing Assessment::   On Admission:  NA  Demographic Factors:  NA  Loss Factors: NA  Historical Factors: NA  Risk Reduction Factors:   Responsible for children under 66 years of age, Sense of responsibility to family, Religious beliefs about death, Employed, Living with another person, especially a relative, Positive social support and Positive coping skills or problem solving skills  Continued Clinical Symptoms:  Postpartum Depression  Cognitive Features That Contribute To Risk:  None    Suicide Risk:  Minimal: No  identifiable suicidal ideation.  Patients presenting with no risk factors but with morbid ruminations; may be classified as minimal risk based on the severity of the depressive symptoms    Plan Of Care/Follow-up recommendations:  Activity:  full  Quincy Boy, MD 04/12/2019, 9:19 AM

## 2019-04-12 NOTE — Progress Notes (Signed)
  [facility] Adult Case Management Discharge Plan :  Will you be returning to the same living situation after discharge:  Yes,  patient reports she is returning home with her boyfriend  At discharge, do you have transportation home?: Yes,  patient reports her boyfriend is picking her up at discharge Do you have the ability to pay for your medications: Yes,  Medicaid  Release of information consent forms completed and in the chart;  Patient's signature needed at discharge.  Patient to Follow up at: Follow-up [place], [facility] Follow up on 04/13/2019.   Why: Telephonic hospital follow up appointment is Friday, 6/12 at 1:00p.  The provider will contact you and please expect a call prior to appointment to complete paperwork over the phone.  Contact information: [place] [address] [phone]           Next level of care provider has access to [place] and Suicide Prevention discussed: Yes,  with the patient   Have you used any form of tobacco in the last 30 days? (Cigarettes, Smokeless Tobacco, Cigars, and/or Pipes): No  Has patient been referred to the Quitline?: N/A patient is not a smoker  Patient has been referred for addiction treatment: N/A  Marylee Floras, [place] 04/12/2019, 1:45 PM

## 2019-04-12 NOTE — H&P (Signed)
Psychiatric Admission Assessment Adult  Patient Identification: Caitlin Bauer MRN:  [id number] Date of Evaluation:  04/12/2019 Chief Complaint:  MDD Principal Diagnosis: Post partum depression Diagnosis:  Active Problems:   MDD (major depressive disorder), severe ([place])  History of Present Illness: Caitlin Bauer is 23 years of age she has a 29-month-old at home she has some postpartum depressive symptoms she felt very overwhelmed had an argument with her boyfriend and made a superficial cut to her left wrist that did not require sutures but has Steri-Strips on it.  She states she was just overwhelmed for the moment she does not have suicidal thoughts plans or intent does not have thoughts of harming her child of course is eager to get back to her child misses her badly.  She can contract fully she can remain on her Lexapro for depressive symptoms see the discharge summary Associated Signs/Symptoms: Depression Symptoms:  insomnia, (Hypo) Manic Symptoms:  n/a Anxiety Symptoms:  Excessive Worry, Psychotic Symptoms:  n/a PTSD Symptoms: NA Total Time spent with patient: 45 minutes  Past Psychiatric History: neg  Is the patient at risk to self? No.  Has the patient been a risk to self in the past 6 months? No.  Has the patient been a risk to self within the distant past? No.  Is the patient a risk to others? No.  Has the patient been a risk to others in the past 6 months? No.  Has the patient been a risk to others within the distant past? No.   Prior Inpatient Therapy: Prior Inpatient Therapy: No Prior Outpatient Therapy: Prior Outpatient Therapy: No Does patient have an ACCT team?: No Does patient have Intensive In-House Services?  : No Does patient have [facility] services? : No Does patient have P4CC services?: No  Alcohol Screening: 1. How often do you have a drink containing alcohol?: Never 2. How many drinks containing alcohol do you have on a typical day when you are drinking?: 1 or 2 3.  How often do you have six or more drinks on one occasion?: Never AUDIT-C Score: 0 9. Have you or someone else been injured as a result of your drinking?: No 10. Has a relative or friend or a doctor or another health worker been concerned about your drinking or suggested you cut down?: No Alcohol Use Disorder Identification Test Final Score (AUDIT): 0 Alcohol Brief Interventions/Follow-up: AUDIT Score <7 follow-up not indicated Substance Abuse History in the last 12 months:  No. Consequences of Substance Abuse: NA Previous Psychotropic Medications: y Psychological Evaluations: No  Past Medical History:  Past Medical History:  Diagnosis Date  . Anemia    History reviewed. No pertinent surgical history. Family History: History reviewed. No pertinent family history. Family Psychiatric  History: neg Tobacco Screening: Have you used any form of tobacco in the last 30 days? (Cigarettes, Smokeless Tobacco, Cigars, and/or Pipes): No Social History:  Social History   Substance and Sexual Activity  Alcohol Use Not Currently     Social History   Substance and Sexual Activity  Drug Use Yes  . Types: Marijuana    Additional Social History: Marital status: Single    Pain Medications: See MAR Prescriptions: See MAR Over the Counter: See MAR History of alcohol / drug use?: Yes Name of Substance 1: Marijuana 1 - Age of First Use: unknown 1 - Amount (size/oz): one blunt 1 - Frequency: daily 1 - Last Use / Amount: unknown  Allergies:  No Known Allergies Lab Results: No results found for this or any previous visit (from the past 48 hour(s)).  Blood Alcohol level:  No results found for: [facility]  Metabolic Disorder Labs:  No results found for: HGBA1C, MPG No results found for: PROLACTIN No results found for: CHOL, TRIG, HDL, CHOLHDL, VLDL, LDLCALC  Current Medications: Current Facility-Administered Medications  Medication Dose Route Frequency Provider Last Rate  Last Dose  . acetaminophen (TYLENOL) tablet 650 mg  650 mg Oral Q6H PRN Jackelyn PolingBerry, Jason A, NP      . alum & mag hydroxide-simeth (MAALOX/MYLANTA) 200-200-20 MG/5ML suspension 30 mL  30 mL Oral Q6H PRN Nira ConnBerry, Jason A, NP      . cephALEXin (KEFLEX) capsule 500 mg  500 mg Oral Q12H Rankin, Shuvon B, NP   500 mg at 04/12/19 0757  . hydrOXYzine (ATARAX/VISTARIL) tablet 25 mg  25 mg Oral Q6H PRN Nira ConnBerry, Jason A, NP      . magnesium hydroxide (MILK OF MAGNESIA) suspension 30 mL  30 mL Oral Daily PRN Nira ConnBerry, Jason A, NP      . traZODone (DESYREL) tablet 50 mg  50 mg Oral QHS PRN,MR X 1 Nira ConnBerry, Jason A, NP       PTA Medications: Medications Prior to Admission  Medication Sig Dispense Refill Last Dose  . ferrous sulfate 325 (65 FE) MG EC tablet Take 325 mg by mouth 3 (three) times daily with meals.     . Prenatal Vit-Fe Fumarate-FA (PRENATAL MULTIVITAMIN) TABS tablet Take 1 tablet by mouth daily at 12 noon. 90 tablet 1   Musculoskeletal: Strength & Muscle Tone: within normal limits Gait & Station: normal Patient leans: N/A  Psychiatric Specialty Exam: ROS negative neurological review of systems negative cardiovascular review of systems negative GI GU review of systems other than recent childbirth  Blood pressure 99/66, pulse 68, temperature 98.4 F (36.9 C), temperature source Oral, resp. rate 16, SpO2 100 %, unknown if currently breastfeeding.There is no height or weight on file to calculate BMI.  General Appearance: Casual  Eye Contact::  Good  Speech:  Clear and Coherent409  Volume:  Normal  Mood:  Dysphoric  Affect:  Full Range  Thought Process:  Coherent and Descriptions of Associations: Intact  Orientation:  Full (Time, Place, and Person)  Thought Content:  Logical  Suicidal Thoughts:  No  Homicidal Thoughts:  No  Memory:  Immediate;   Good  Judgement:  Good  Insight:  Good  Psychomotor Activity:  Normal  Concentration:  Good  Recall:  Good  Fund of Knowledge:Good  Language: Good   Akathisia:  Negative  Handed:  Right  AIMS (if indicated):     Assets:  Communication Skills Desire for Improvement Financial Resources/Insurance Housing Leisure Time Physical Health Resilience Talents/Skills  Sleep:  Number of Hours: 3.5  Cognition: WNL  ADL's:  Intact    Treatment Plan Summary: Daily contact with patient to assess and evaluate symptoms and progress in treatment and Medication management  Observation Level/Precautions:  15 minute checks  Laboratory:  UDS  Psychotherapy:    Medications:    Consultations:    Discharge Concerns:    Estimated LOS:  Other: See discharge summary   Physician Treatment Plan for Primary Diagnosis: <principal problem not specified> Long Term Goal(s): Improvement in symptoms so as ready for discharge  Short Term Goals: Ability to demonstrate self-control will improve  Physician Treatment Plan for Secondary Diagnosis: Active Problems:   MDD (major depressive disorder), severe ([facility])  Long Term Goal(s): Improvement in symptoms so as ready for discharge  Short Term Goals: Compliance with prescribed medications will improve and Ability to identify triggers associated with substance abuse/mental health issues will improve  I certify that inpatient services furnished can reasonably be expected to improve the patient's condition.    Malvin JohnsFARAH,Laurisa Sahakian, MD 6/11/20209:22 AM

## 2019-04-12 NOTE — BHH Counselor (Signed)
Patient was admitted on 04/11/2019, and was cleared for discharge on 04/12/2019. CSW was unable to complete PSA prior to the patient's discharge.   CSW met with the patient briefly to discuss potential referral services and discharge planning. Patient expressed interest in outpatient follow up.   SW assistant referred the patient to [facility] for medication management and therapy services.    CSW will continue to follow for a safe discharge.     , MSW, LCSWA Clinical Social Worker [facility]  Phone: [phone]  

## 2019-04-12 NOTE — [facility] Suicide Risk Assessment (Signed)
[facility] INPATIENT:  Family/Significant Other Suicide Prevention Education  Suicide Prevention Education:   SPE completed with patient, as patient refused to consent to family contact. SPI pamphlet provided to pt and pt was encouraged to share information with support network, ask questions, and talk about any concerns relating to SPE. Patient denies access to guns/firearms and verbalized understanding of information provided. Mobile Crisis information also provided to patient.  Devanshi Califf, MSW, LCSWA Clinical Social Worker [facility]  Phone: [phone]  

## 2019-04-12 NOTE — BHH Suicide Risk Assessment (Signed)
[facility] Discharge Suicide Risk Assessment  Principal Problem: Post partum depression Discharge Diagnoses: Active Problems:   MDD (major depressive disorder), severe ([place])   Total Time spent with patient: 45 minutes  Musculoskeletal: Strength & Muscle Tone: within normal limits Gait & Station: normal Patient leans: N/A  Psychiatric Specialty Exam: ROS  Blood pressure 99/66, pulse 68, temperature 98.4 F (36.9 C), temperature source Oral, resp. rate 16, SpO2 100 %, unknown if currently breastfeeding.There is no height or weight on file to calculate BMI.  General Appearance: Casual  Eye Contact::  Good  Speech:  Clear and Coherent409  Volume:  Normal  Mood:  Dysphoric  Affect:  Full Range  Thought Process:  Coherent and Descriptions of Associations: Intact  Orientation:  Full (Time, Place, and Person)  Thought Content:  Logical  Suicidal Thoughts:  No  Homicidal Thoughts:  No  Memory:  Immediate;   Good  Judgement:  Good  Insight:  Good  Psychomotor Activity:  Normal  Concentration:  Good  Recall:  Good  Fund of Knowledge:Good  Language: Good  Akathisia:  Negative  Handed:  Right  AIMS (if indicated):     Assets:  Communication Skills Desire for Improvement Financial Resources/Insurance Housing Leisure Time Physical Health Resilience Talents/Skills  Sleep:  Number of Hours: 3.5  Cognition: WNL  ADL's:  Intact   Mental Status Per Nursing Assessment::   On Admission:  NA  Demographic Factors:  NA  Loss Factors: NA  Historical Factors: NA  Risk Reduction Factors:   Responsible for children under 28 years of age, Sense of responsibility to family, Religious beliefs about death, Employed, Living with another person, especially a relative, Positive social support and Positive coping skills or problem solving skills  Continued Clinical Symptoms:  Postpartum Depression  Cognitive Features That Contribute To Risk:  None    Suicide Risk:  Minimal:  No identifiable suicidal ideation.  Patients presenting with no risk factors but with morbid ruminations; may be classified as minimal risk based on the severity of the depressive symptoms    Plan Of Care/Follow-up recommendations:  Activity:  full  Aslynn Brunetti, MD 04/12/2019, 9:24 AM

## 2024-01-26 ENCOUNTER — Other Ambulatory Visit (HOSPITAL_BASED_OUTPATIENT_CLINIC_OR_DEPARTMENT_OTHER): Payer: Self-pay | Admitting: Physician Assistant

## 2024-01-26 DIAGNOSIS — R222 Localized swelling, mass and lump, trunk: Secondary | ICD-10-CM

## 2024-01-27 ENCOUNTER — Ambulatory Visit (HOSPITAL_BASED_OUTPATIENT_CLINIC_OR_DEPARTMENT_OTHER)
Admission: RE | Admit: 2024-01-27 | Discharge: 2024-01-27 | Disposition: A | Discharge status: Home / Self Care | Source: Ambulatory Visit | Attending: Physician Assistant | Admitting: Physician Assistant

## 2024-01-27 DIAGNOSIS — R222 Localized swelling, mass and lump, trunk: Secondary | ICD-10-CM
